# Patient Record
Sex: Female | Born: 1935
Health system: Southern US, Community
[De-identification: ages and names within clinical notes are randomized; demographics above are authoritative.]

## PROBLEM LIST (undated history)

## (undated) DIAGNOSIS — I10 Essential (primary) hypertension: Secondary | ICD-10-CM

## (undated) DIAGNOSIS — C801 Malignant (primary) neoplasm, unspecified: Secondary | ICD-10-CM

## (undated) DIAGNOSIS — R569 Unspecified convulsions: Secondary | ICD-10-CM

## (undated) DIAGNOSIS — I639 Cerebral infarction, unspecified: Secondary | ICD-10-CM

## (undated) DIAGNOSIS — E079 Disorder of thyroid, unspecified: Secondary | ICD-10-CM

## (undated) HISTORY — PX: TONSILLECTOMY: SUR1361

## (undated) HISTORY — PX: SKIN BIOPSY: SHX1

---

## 2013-02-18 ENCOUNTER — Emergency Department (HOSPITAL_COMMUNITY)
Admission: EM | Admit: 2013-02-18 | Discharge: 2013-02-18 | Disposition: A | Discharge status: Home / Self Care | Payer: No Typology Code available for payment source | Attending: Emergency Medicine | Admitting: Emergency Medicine

## 2013-02-18 ENCOUNTER — Encounter (HOSPITAL_COMMUNITY): Payer: Self-pay | Admitting: Emergency Medicine

## 2013-02-18 ENCOUNTER — Emergency Department (HOSPITAL_COMMUNITY): Payer: No Typology Code available for payment source

## 2013-02-18 DIAGNOSIS — S2220XA Unspecified fracture of sternum, initial encounter for closed fracture: Secondary | ICD-10-CM | POA: Insufficient documentation

## 2013-02-18 DIAGNOSIS — Z85828 Personal history of other malignant neoplasm of skin: Secondary | ICD-10-CM | POA: Insufficient documentation

## 2013-02-18 DIAGNOSIS — I10 Essential (primary) hypertension: Secondary | ICD-10-CM | POA: Insufficient documentation

## 2013-02-18 DIAGNOSIS — E079 Disorder of thyroid, unspecified: Secondary | ICD-10-CM | POA: Insufficient documentation

## 2013-02-18 DIAGNOSIS — Y92410 Unspecified street and highway as the place of occurrence of the external cause: Secondary | ICD-10-CM | POA: Insufficient documentation

## 2013-02-18 DIAGNOSIS — Z79899 Other long term (current) drug therapy: Secondary | ICD-10-CM | POA: Insufficient documentation

## 2013-02-18 DIAGNOSIS — Y939 Activity, unspecified: Secondary | ICD-10-CM | POA: Insufficient documentation

## 2013-02-18 HISTORY — DX: Essential (primary) hypertension: I10

## 2013-02-18 HISTORY — DX: Disorder of thyroid, unspecified: E07.9

## 2013-02-18 HISTORY — DX: Malignant (primary) neoplasm, unspecified: C80.1

## 2013-02-18 NOTE — ED Notes (Signed)
Discharge instructions reviewed with pt and her family. Pt verbalized understanding.
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
## 2013-02-18 NOTE — ED Notes (Signed)
Pt returned from xray

## 2013-02-18 NOTE — ED Notes (Signed)
Dr Sheldon at bedside  

## 2013-02-18 NOTE — ED Notes (Signed)
Pt transported to xray 
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
## 2016-01-24 DIAGNOSIS — G40209 Localization-related (focal) (partial) symptomatic epilepsy and epileptic syndromes with complex partial seizures, not intractable, without status epilepticus: Secondary | ICD-10-CM | POA: Diagnosis not present

## 2016-05-03 DIAGNOSIS — E034 Atrophy of thyroid (acquired): Secondary | ICD-10-CM | POA: Diagnosis not present

## 2016-05-03 DIAGNOSIS — I10 Essential (primary) hypertension: Secondary | ICD-10-CM | POA: Diagnosis not present

## 2016-05-05 DIAGNOSIS — I10 Essential (primary) hypertension: Secondary | ICD-10-CM | POA: Diagnosis not present

## 2016-05-05 DIAGNOSIS — E039 Hypothyroidism, unspecified: Secondary | ICD-10-CM | POA: Diagnosis not present

## 2016-06-25 ENCOUNTER — Emergency Department (HOSPITAL_BASED_OUTPATIENT_CLINIC_OR_DEPARTMENT_OTHER)
Admission: EM | Admit: 2016-06-25 | Discharge: 2016-06-26 | Disposition: A | Discharge status: Home / Self Care | Payer: PPO | Attending: Emergency Medicine | Admitting: Emergency Medicine

## 2016-06-25 ENCOUNTER — Emergency Department (HOSPITAL_BASED_OUTPATIENT_CLINIC_OR_DEPARTMENT_OTHER): Payer: PPO

## 2016-06-25 ENCOUNTER — Encounter (HOSPITAL_BASED_OUTPATIENT_CLINIC_OR_DEPARTMENT_OTHER): Payer: Self-pay | Admitting: Emergency Medicine

## 2016-06-25 DIAGNOSIS — Y939 Activity, unspecified: Secondary | ICD-10-CM | POA: Diagnosis not present

## 2016-06-25 DIAGNOSIS — Y929 Unspecified place or not applicable: Secondary | ICD-10-CM | POA: Diagnosis not present

## 2016-06-25 DIAGNOSIS — W108XXA Fall (on) (from) other stairs and steps, initial encounter: Secondary | ICD-10-CM | POA: Insufficient documentation

## 2016-06-25 DIAGNOSIS — M25511 Pain in right shoulder: Secondary | ICD-10-CM | POA: Diagnosis not present

## 2016-06-25 DIAGNOSIS — I10 Essential (primary) hypertension: Secondary | ICD-10-CM | POA: Diagnosis not present

## 2016-06-25 DIAGNOSIS — Y999 Unspecified external cause status: Secondary | ICD-10-CM | POA: Insufficient documentation

## 2016-06-25 DIAGNOSIS — Z85828 Personal history of other malignant neoplasm of skin: Secondary | ICD-10-CM | POA: Insufficient documentation

## 2016-06-25 DIAGNOSIS — S42291A Other displaced fracture of upper end of right humerus, initial encounter for closed fracture: Secondary | ICD-10-CM | POA: Diagnosis not present

## 2016-06-25 DIAGNOSIS — Z79899 Other long term (current) drug therapy: Secondary | ICD-10-CM | POA: Insufficient documentation

## 2016-06-25 DIAGNOSIS — S42201A Unspecified fracture of upper end of right humerus, initial encounter for closed fracture: Secondary | ICD-10-CM | POA: Diagnosis not present

## 2016-06-25 HISTORY — DX: Unspecified convulsions: R56.9

## 2016-06-25 MED ORDER — KETOROLAC TROMETHAMINE 30 MG/ML IJ SOLN
30.0000 mg | Freq: Once | INTRAMUSCULAR | Status: AC
  Administered 2016-06-25: 30 mg via INTRAVENOUS
  Filled 2016-06-25: qty 1

## 2016-06-25 NOTE — ED Notes (Signed)
Pt tripped going up the steps on her porch around 2030 tonight.  She has a deformity and swelling with bruising on upper right arm near shoulder.  Distal pulses present, pt can move fingers and denies numbness or tingling.

## 2016-06-25 NOTE — ED Notes (Signed)
Pt reports tripping and falling onto right shoulder. Denies LOC but hit side of neck on a post. No bleeding at triage. Alert and oriented. NAD. No blood thinners.

## 2016-06-25 NOTE — ED Provider Notes (Signed)
CSN: [id number]     Arrival date & time 06/25/16  2122 History  By signing my name below, I, Georgette Shell, attest that this documentation has been prepared under the direction and in the presence of Reilyn Nelson, MD. Electronically Signed: Georgette Shell, ED Scribe. 06/25/2016. 11:15 PM.   Chief Complaint  Patient presents with  . Fall  . Shoulder Injury    Patient is a 82 y.o. female presenting with shoulder injury. The history is provided by the patient. No language interpreter was used.  Shoulder Injury This is a new problem. The current episode started 3 to 5 hours ago. The problem occurs constantly. The problem has not changed since onset.Pertinent negatives include no chest pain, no abdominal pain, no headaches and no shortness of breath. Nothing aggravates the symptoms. Nothing relieves the symptoms. The treatment provided no relief.    HPI Comments: Sheri Hoffman is a 82 y.o. female who presents to the Emergency Department complaining of right shoulder pain s/p mechanical fall around 2030 tonight. Pt was going up the steps on her porch, pt fell onto her right shoulder.  No head injury.  No LOC no vomiting.  No loss of sensation.  FROM of the forearm wrist and hand. Pt reports she is in no pain unless she moves her right shoulder. No LOC. No alleviating factors noted. Patient is not on blood thinners. Pt denies numbness, vomiting and paresthesia.    Past Medical History  Diagnosis Date  . Hypertension   . Thyroid disease   . Cancer ([place])     skin cancer  . Seizures ([place])     possible, pt is on medication to prevent seizures   Past Surgical History  Procedure Laterality Date  . Skin biopsy    . Tonsillectomy     No family history on file. Social History  Substance Use Topics  . Smoking status: Never Smoker   . Smokeless tobacco: None  . Alcohol Use: No   OB History    No data available     Review of Systems  Constitutional: Negative for fever.  Eyes: Negative for  photophobia and visual disturbance.  Respiratory: Negative for shortness of breath.   Cardiovascular: Negative for chest pain.  Gastrointestinal: Negative for nausea, vomiting and abdominal pain.  Musculoskeletal: Positive for arthralgias. Negative for neck pain and neck stiffness.  Neurological: Negative for dizziness, seizures, syncope, facial asymmetry, weakness, numbness and headaches.  All other systems reviewed and are negative.     Allergies  Hydrochlorothiazide  Home Medications   Prior to Admission medications   Medication Sig Start Date End Date Taking? Authorizing Provider  amLODipine (NORVASC) 5 MG tablet Take 2.5 mg by mouth at bedtime.   Yes Historical Provider, MD  carvedilol (COREG) 25 MG tablet Take 12.5 mg by mouth daily.    Yes Historical Provider, MD  levETIRAcetam (KEPPRA) 500 MG tablet Take 500 mg by mouth 2 (two) times daily.   Yes Historical Provider, MD  levothyroxine (SYNTHROID, LEVOTHROID) 88 MCG tablet Take 88 mcg by mouth daily.   Yes Historical Provider, MD   BP 165/78 mmHg  Pulse 68  Temp(Src) 98.3 F (36.8 C) (Oral)  Resp 16  Ht 4\' 10"  (1.473 m)  Wt 100 lb (45.36 kg)  BMI 20.91 kg/m2  SpO2 95% Physical Exam  Constitutional: She appears well-developed and well-nourished. No distress.  HENT:  Head: Normocephalic. Head is without raccoon's eyes and without Battle's sign.  Right Ear: No mastoid tenderness. No hemotympanum.  Left Ear: No mastoid tenderness. No hemotympanum.  Mouth/Throat: Oropharynx is clear and moist. No oropharyngeal exudate.  Eyes: Conjunctivae and EOM are normal. Pupils are equal, round, and reactive to light. Right eye exhibits no discharge. Left eye exhibits no discharge. No scleral icterus.  No battle signs, no racoon eyes  Neck: Normal range of motion. Neck supple. No JVD present. No tracheal deviation present.  Cardiovascular: Normal rate, regular rhythm, normal heart sounds and intact distal pulses.   No murmur  heard. Pulmonary/Chest: Effort normal and breath sounds normal. No stridor. No respiratory distress. She has no wheezes. She has no rales.  Lungs CTA bilaterally.  Abdominal: Soft. Bowel sounds are normal. She exhibits no distension. There is no tenderness. There is no rebound and no guarding.  Musculoskeletal: She exhibits tenderness.       Right shoulder: She exhibits bony tenderness and swelling. She exhibits no effusion, no crepitus, no laceration, no spasm and normal pulse.       Right elbow: Normal.      Right wrist: Normal.       Cervical back: Normal.       Right upper arm: Normal.       Right forearm: Normal.       Right hand: Normal. She exhibits normal capillary refill. Normal sensation noted. Normal strength noted.  Lymphadenopathy:    She has no cervical adenopathy.  Neurological: She is alert. She displays normal reflexes. She exhibits normal muscle tone.  Skin: Skin is warm.  Bruise over the mid bicep, impacted.There is no crepitus, no effusion.   Psychiatric: She has a normal mood and affect. Her behavior is normal.  Nursing note and vitals reviewed.   ED Course  Procedures  DIAGNOSTIC STUDIES: Oxygen Saturation is 95% on RA, poor by my interpretation.    COORDINATION OF CARE: 11:15 PM Discussed treatment plan with pt at bedside which includes Orthopedic follow-up and pt agreed to plan.  Imaging Review Dg Shoulder Right  06/25/2016  CLINICAL DATA:  Status post fall. EXAM: RIGHT SHOULDER - 2+ VIEW COMPARISON:  None. FINDINGS: There is a comminuted fracture of the right humeral neck with overriding fracture fragments and superior displacement of the distal fracture fragment. There is a probable associated anterior glenohumeral dislocation. There is an associated soft tissue swelling. IMPRESSION: Comminuted impacted fracture of the right humeral neck with probable associated anterior dislocation of the shoulder. Electronically Signed   By: Fidela Salisbury M.D.   On:  06/25/2016 22:18   I have personally reviewed and evaluated these images as part of my medical decision-making.   MDM   Final diagnoses:  None    Filed Vitals:   06/25/16 2332 06/26/16 0147  BP: 152/67 143/73  Pulse: 68 69  Temp:    Resp: 16 16   No results found for this or any previous visit. Dg Shoulder Right  06/25/2016  CLINICAL DATA:  Status post fall. EXAM: RIGHT SHOULDER - 2+ VIEW COMPARISON:  None. FINDINGS: There is a comminuted fracture of the right humeral neck with overriding fracture fragments and superior displacement of the distal fracture fragment. There is a probable associated anterior glenohumeral dislocation. There is an associated soft tissue swelling. IMPRESSION: Comminuted impacted fracture of the right humeral neck with probable associated anterior dislocation of the shoulder. Electronically Signed   By: Fidela Salisbury M.D.   On: 06/25/2016 22:18   Ct Shoulder Right Wo Contrast  06/26/2016  CLINICAL DATA:  [place] yesterday. Right humeral fracture on plain  films. EXAM: CT OF THE RIGHT SHOULDER WITHOUT CONTRAST TECHNIQUE: Multidetector CT imaging was performed according to the standard protocol. Multiplanar CT image reconstructions were also generated. COMPARISON:  Right humerus 06/25/2016 FINDINGS: There are comminuted fractures of the right humeral head and neck with fracture lines extending into the greater and lesser trochanteric regions. There is impaction of the fracture fragments with medial rotation of the major humeral head fragment. No glenohumeral or acromioclavicular dislocation. The glenoid and scapula appear intact. Visualized clavicle appears intact. Visualized ribs appear intact. Infiltration in the subcutaneous and intramuscular fat of the right shoulder consistent with hematoma. Incidental note of emphysematous changes in the right upper lung. Degenerative changes in the spine. No focal bone lesions appreciated. IMPRESSION: Comminuted fractures of  the right humeral head and neck with impaction of fracture fragments and medial rotation of the humeral head fragment. No evidence of glenohumeral dislocation. Electronically Signed   By: Lucienne Capers M.D.   On: 06/26/2016 01:18    Medications  ketorolac (TORADOL) 30 MG/ML injection 30 mg (30 mg Intravenous Given 06/25/16 2332)    1130 Case d/w Dr. Rolena Infante of orthopedics, obtain CT please call back with results  130 case d/e Dr. Rolena Infante please place in immobilizer have patient contact the office this am to be seen by Dr. Veverly Fells later today.    Images reviewed with patient and family using in room computer screen.  Nurse present for all instructions.  All questions answered to family's satisfaction.   Patient and family understand all discharge and follow up instructions.  RX for pain medication provided.  Continue to ice shoulder.   Strict return precautions given   I personally performed the services described in this documentation, which was scribed in my presence. The recorded information has been reviewed and is accurate.     Veatrice Kells, MD 06/26/16 [phone]

## 2016-06-26 ENCOUNTER — Encounter (HOSPITAL_BASED_OUTPATIENT_CLINIC_OR_DEPARTMENT_OTHER): Payer: Self-pay | Admitting: Emergency Medicine

## 2016-06-26 DIAGNOSIS — S42291A Other displaced fracture of upper end of right humerus, initial encounter for closed fracture: Secondary | ICD-10-CM | POA: Diagnosis not present

## 2016-06-26 MED ORDER — TRAMADOL HCL 50 MG PO TABS: 50.0000 mg | ORAL_TABLET | Freq: Four times a day (QID) | ORAL | Status: AC | PRN

## 2016-06-26 NOTE — ED Notes (Signed)
Pt returned from CT °

## 2016-06-26 NOTE — ED Notes (Signed)
Pt and family verbalize understanding of dc instructions and deny any further needs at this time 

## 2016-06-26 NOTE — ED Notes (Signed)
Patient transported to CT 

## 2016-06-26 NOTE — ED Notes (Signed)
Pt ambulated to restroom. 

## 2016-07-03 DIAGNOSIS — S42271A Torus fracture of upper end of right humerus, initial encounter for closed fracture: Secondary | ICD-10-CM | POA: Diagnosis not present

## 2016-07-19 DIAGNOSIS — S42271D Torus fracture of upper end of right humerus, subsequent encounter for fracture with routine healing: Secondary | ICD-10-CM | POA: Diagnosis not present

## 2016-08-07 DIAGNOSIS — S42271D Torus fracture of upper end of right humerus, subsequent encounter for fracture with routine healing: Secondary | ICD-10-CM | POA: Diagnosis not present

## 2016-08-13 DIAGNOSIS — Z9181 History of falling: Secondary | ICD-10-CM | POA: Diagnosis not present

## 2016-08-13 DIAGNOSIS — S42271D Torus fracture of upper end of right humerus, subsequent encounter for fracture with routine healing: Secondary | ICD-10-CM | POA: Diagnosis not present

## 2016-08-15 DIAGNOSIS — Z9181 History of falling: Secondary | ICD-10-CM | POA: Diagnosis not present

## 2016-08-15 DIAGNOSIS — S42271D Torus fracture of upper end of right humerus, subsequent encounter for fracture with routine healing: Secondary | ICD-10-CM | POA: Diagnosis not present

## 2016-08-21 DIAGNOSIS — S42271D Torus fracture of upper end of right humerus, subsequent encounter for fracture with routine healing: Secondary | ICD-10-CM | POA: Diagnosis not present

## 2016-08-21 DIAGNOSIS — Z9181 History of falling: Secondary | ICD-10-CM | POA: Diagnosis not present

## 2016-08-27 DIAGNOSIS — Z9181 History of falling: Secondary | ICD-10-CM | POA: Diagnosis not present

## 2016-08-27 DIAGNOSIS — S42271D Torus fracture of upper end of right humerus, subsequent encounter for fracture with routine healing: Secondary | ICD-10-CM | POA: Diagnosis not present

## 2016-09-03 DIAGNOSIS — S42271D Torus fracture of upper end of right humerus, subsequent encounter for fracture with routine healing: Secondary | ICD-10-CM | POA: Diagnosis not present

## 2016-09-03 DIAGNOSIS — Z9181 History of falling: Secondary | ICD-10-CM | POA: Diagnosis not present

## 2016-09-04 DIAGNOSIS — G40209 Localization-related (focal) (partial) symptomatic epilepsy and epileptic syndromes with complex partial seizures, not intractable, without status epilepticus: Secondary | ICD-10-CM | POA: Diagnosis not present

## 2016-09-10 DIAGNOSIS — Z9181 History of falling: Secondary | ICD-10-CM | POA: Diagnosis not present

## 2016-09-10 DIAGNOSIS — S42271D Torus fracture of upper end of right humerus, subsequent encounter for fracture with routine healing: Secondary | ICD-10-CM | POA: Diagnosis not present

## 2016-09-11 DIAGNOSIS — S42271D Torus fracture of upper end of right humerus, subsequent encounter for fracture with routine healing: Secondary | ICD-10-CM | POA: Diagnosis not present

## 2016-09-17 DIAGNOSIS — Z9181 History of falling: Secondary | ICD-10-CM | POA: Diagnosis not present

## 2016-09-17 DIAGNOSIS — S42271D Torus fracture of upper end of right humerus, subsequent encounter for fracture with routine healing: Secondary | ICD-10-CM | POA: Diagnosis not present

## 2016-09-24 DIAGNOSIS — S42271D Torus fracture of upper end of right humerus, subsequent encounter for fracture with routine healing: Secondary | ICD-10-CM | POA: Diagnosis not present

## 2016-09-24 DIAGNOSIS — Z9181 History of falling: Secondary | ICD-10-CM | POA: Diagnosis not present

## 2016-09-25 DIAGNOSIS — Z23 Encounter for immunization: Secondary | ICD-10-CM | POA: Diagnosis not present

## 2016-09-25 DIAGNOSIS — I10 Essential (primary) hypertension: Secondary | ICD-10-CM | POA: Diagnosis not present

## 2016-09-25 DIAGNOSIS — E039 Hypothyroidism, unspecified: Secondary | ICD-10-CM | POA: Diagnosis not present

## 2016-10-01 DIAGNOSIS — Z9181 History of falling: Secondary | ICD-10-CM | POA: Diagnosis not present

## 2016-10-01 DIAGNOSIS — S42271D Torus fracture of upper end of right humerus, subsequent encounter for fracture with routine healing: Secondary | ICD-10-CM | POA: Diagnosis not present

## 2016-12-28 DIAGNOSIS — E039 Hypothyroidism, unspecified: Secondary | ICD-10-CM | POA: Diagnosis not present

## 2016-12-28 DIAGNOSIS — I10 Essential (primary) hypertension: Secondary | ICD-10-CM | POA: Diagnosis not present

## 2017-03-04 DIAGNOSIS — G40209 Localization-related (focal) (partial) symptomatic epilepsy and epileptic syndromes with complex partial seizures, not intractable, without status epilepticus: Secondary | ICD-10-CM | POA: Diagnosis not present

## 2017-06-28 DIAGNOSIS — E039 Hypothyroidism, unspecified: Secondary | ICD-10-CM | POA: Diagnosis not present

## 2017-06-28 DIAGNOSIS — I10 Essential (primary) hypertension: Secondary | ICD-10-CM | POA: Diagnosis not present

## 2017-06-28 DIAGNOSIS — Z7689 Persons encountering health services in other specified circumstances: Secondary | ICD-10-CM | POA: Diagnosis not present

## 2017-06-28 DIAGNOSIS — G40209 Localization-related (focal) (partial) symptomatic epilepsy and epileptic syndromes with complex partial seizures, not intractable, without status epilepticus: Secondary | ICD-10-CM | POA: Diagnosis not present

## 2017-08-16 DIAGNOSIS — E039 Hypothyroidism, unspecified: Secondary | ICD-10-CM | POA: Diagnosis not present

## 2017-08-16 DIAGNOSIS — I10 Essential (primary) hypertension: Secondary | ICD-10-CM | POA: Diagnosis not present

## 2017-09-02 DIAGNOSIS — G40209 Localization-related (focal) (partial) symptomatic epilepsy and epileptic syndromes with complex partial seizures, not intractable, without status epilepticus: Secondary | ICD-10-CM | POA: Diagnosis not present

## 2017-12-30 DIAGNOSIS — Z0000 Encounter for general adult medical examination without abnormal findings: Secondary | ICD-10-CM | POA: Diagnosis not present

## 2017-12-30 DIAGNOSIS — M8589 Other specified disorders of bone density and structure, multiple sites: Secondary | ICD-10-CM | POA: Diagnosis not present

## 2017-12-30 DIAGNOSIS — Z23 Encounter for immunization: Secondary | ICD-10-CM | POA: Diagnosis not present

## 2017-12-30 DIAGNOSIS — L89312 Pressure ulcer of right buttock, stage 2: Secondary | ICD-10-CM | POA: Diagnosis not present

## 2017-12-30 DIAGNOSIS — I10 Essential (primary) hypertension: Secondary | ICD-10-CM | POA: Diagnosis not present

## 2017-12-30 DIAGNOSIS — E039 Hypothyroidism, unspecified: Secondary | ICD-10-CM | POA: Diagnosis not present

## 2018-03-04 DIAGNOSIS — G40209 Localization-related (focal) (partial) symptomatic epilepsy and epileptic syndromes with complex partial seizures, not intractable, without status epilepticus: Secondary | ICD-10-CM | POA: Diagnosis not present

## 2018-06-02 IMAGING — DX DG SHOULDER 2+V*R*
2 series · 2 of 2 positions shown · non-contrast
Comparison: None.

CLINICAL DATA: Status post fall.

EXAM:
RIGHT SHOULDER - 2+ VIEW

[shoulder grashey]
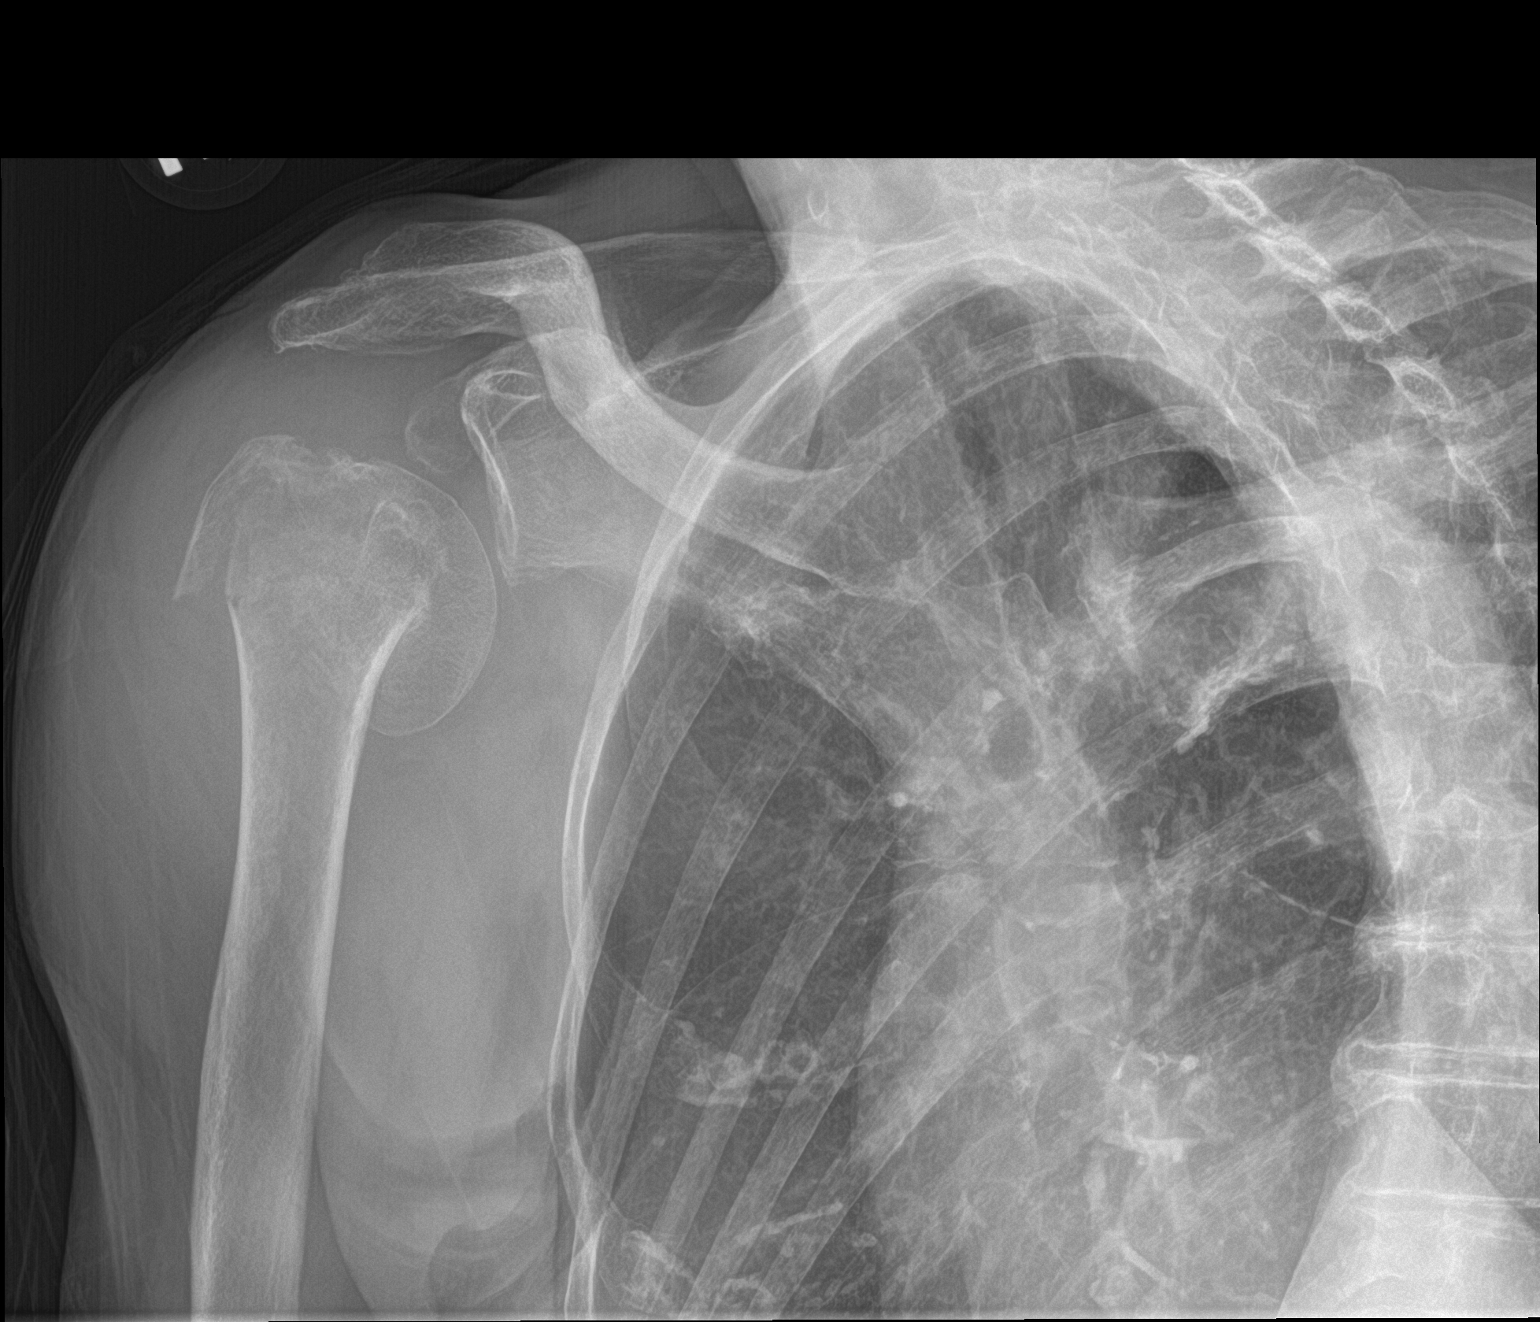

[shoulder y view]
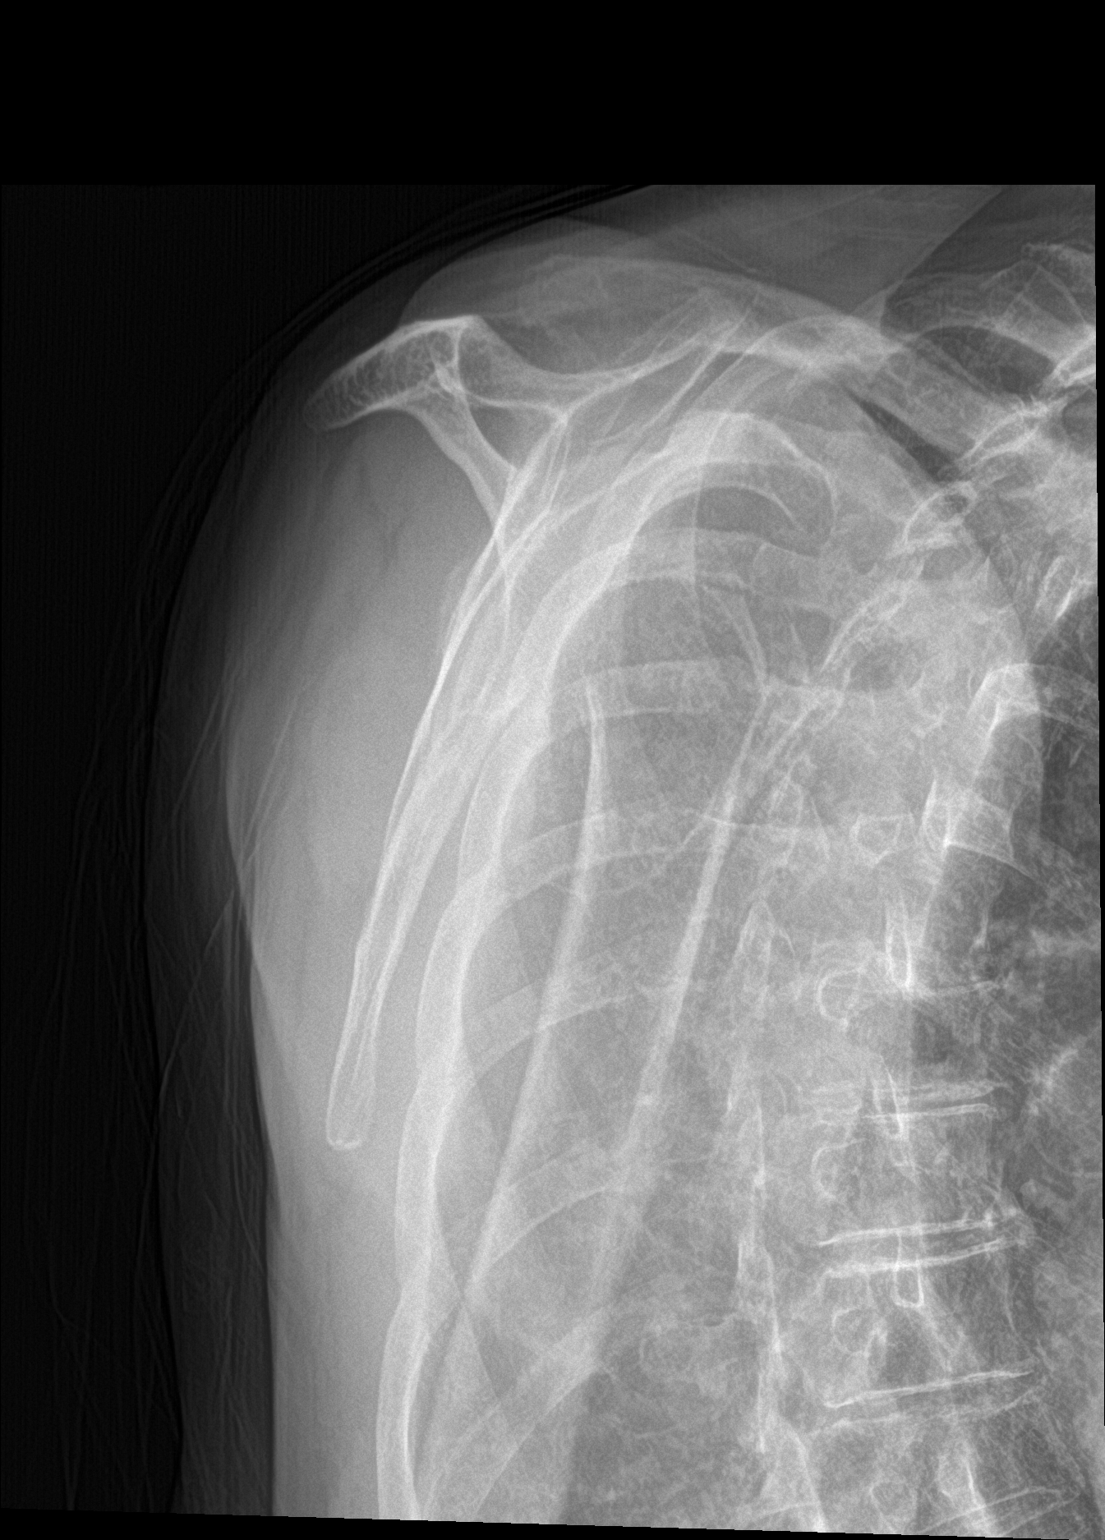

[2 of 2 positions shown; findings below may reference images not displayed]

FINDINGS: There is a comminuted fracture of the right humeral neck with
overriding fracture fragments and superior displacement of the
distal fracture fragment. There is a probable associated anterior
glenohumeral dislocation. There is an associated soft tissue
swelling.
IMPRESSION: Comminuted impacted fracture of the right humeral neck with probable
associated anterior dislocation of the shoulder.

## 2018-06-03 IMAGING — CT CT SHOULDER*R* W/O CM
3 series · 13 of 33 positions shown, 16 images · non-contrast
Comparison: Right humerus 06/25/2016

CLINICAL DATA: Fell yesterday. Right humeral fracture on plain
films.

EXAM:
CT OF THE RIGHT SHOULDER WITHOUT CONTRAST
TECHNIQUE: Multidetector CT imaging was performed according to the standard
protocol. Multiplanar CT image reconstructions were also generated.

[Series 6: cor obl st · coronal · 0.29mm/px · 3 of 99 slices shown]
[im 20/99  bone]
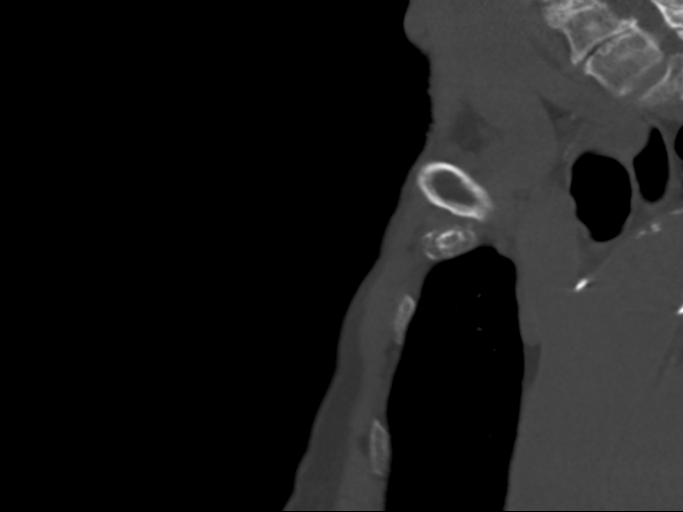
[im 40/99  bone]
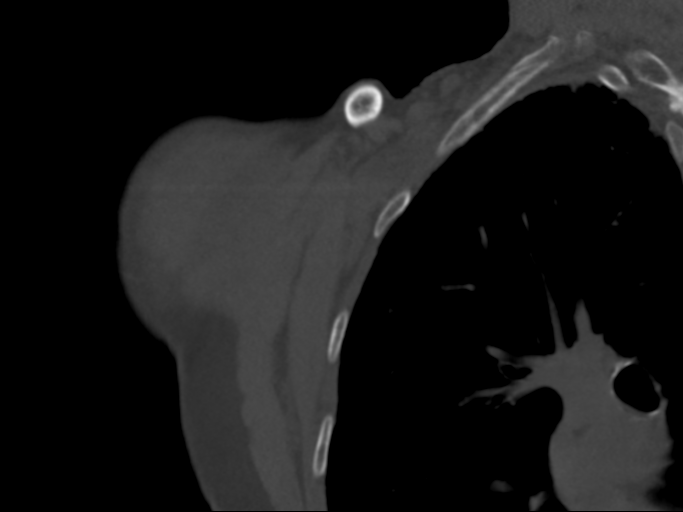
[im 59/99  bone]
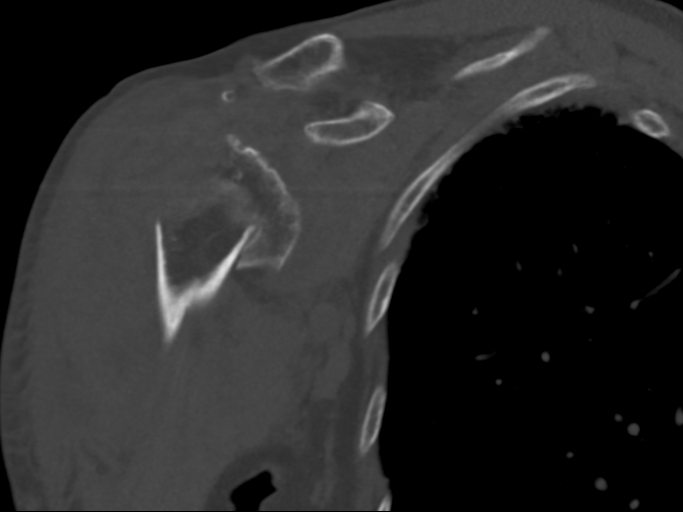

[Series 8: sag obl st · sagittal · 0.29mm/px · 5 of 118 slices shown, 6 images]
[im 40/118  bone]
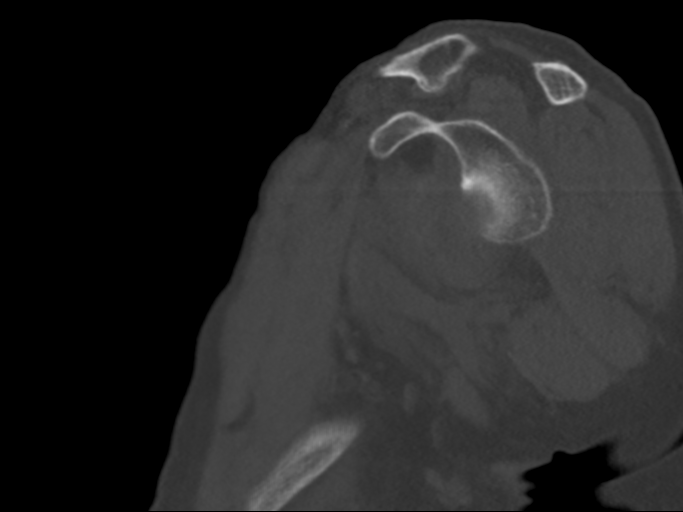
[im 49/118  bone]
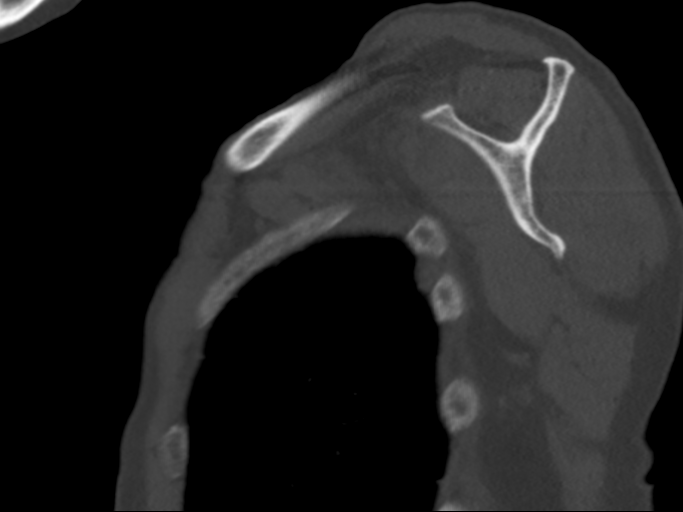
[im 59/118  soft-tissue]
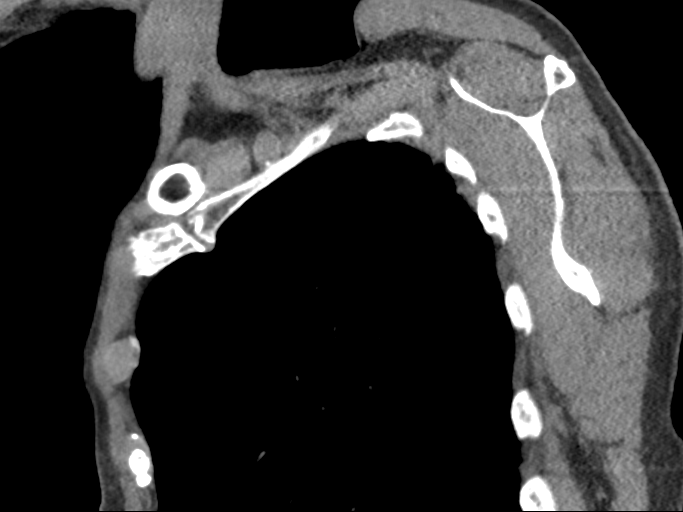
[im 59/118  bone]
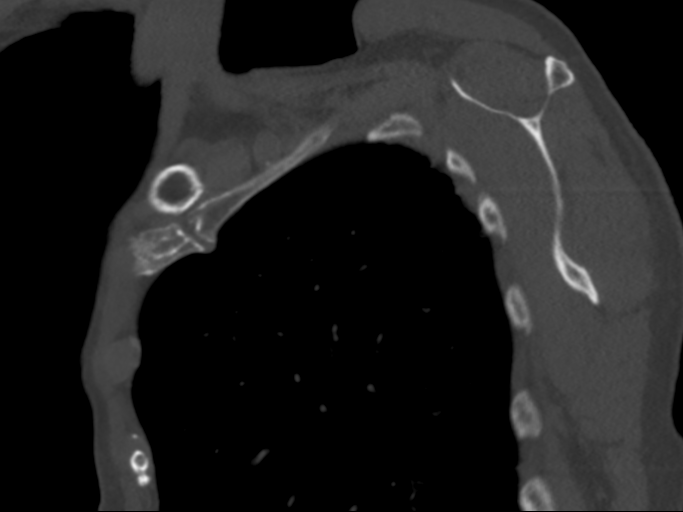
[im 69/118  bone]
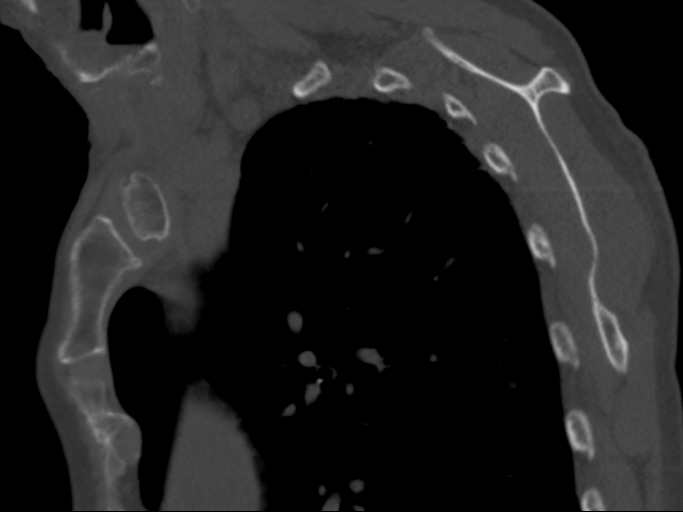
[im 79/118  bone]
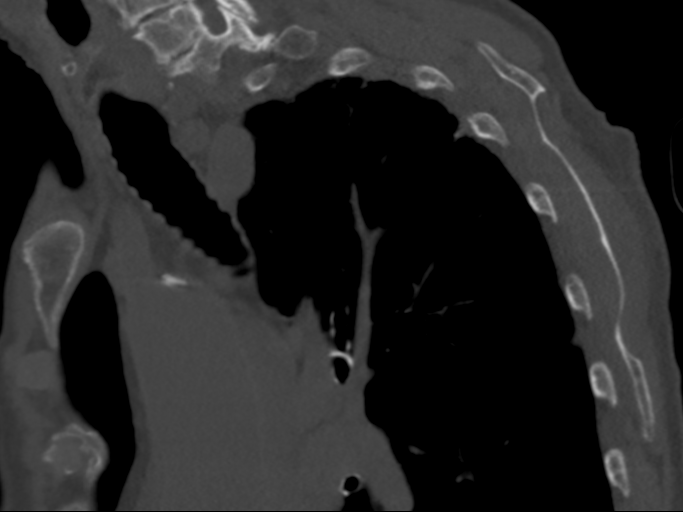

[Series 10: ax st · axial · 0.39mm/px · z∈[-179,-77]mm · 5 of 75 slices shown, 7 images]
[im 12/75  soft-tissue]
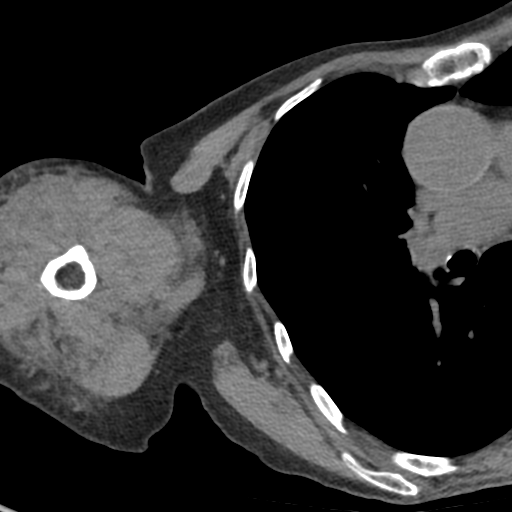
[im 12/75  bone]
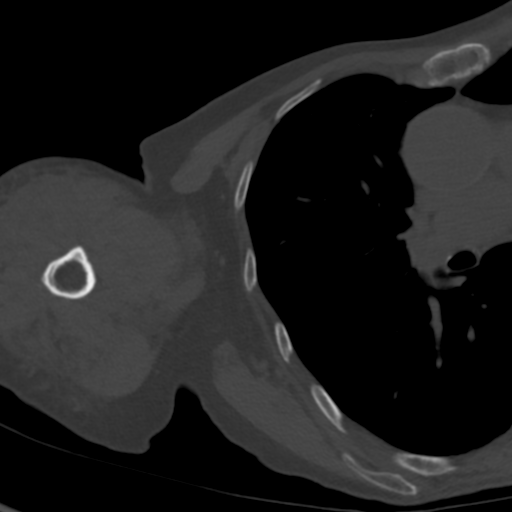
[im 23/75  bone]
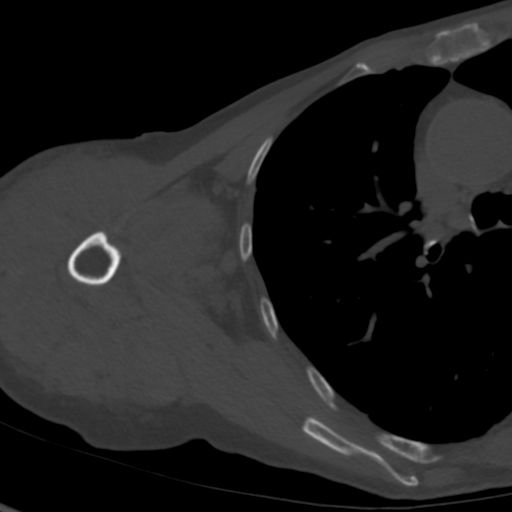
[im 40/75  bone]
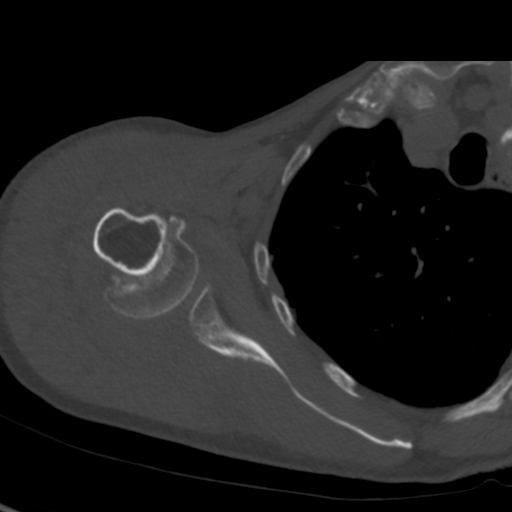
[im 52/75  bone]
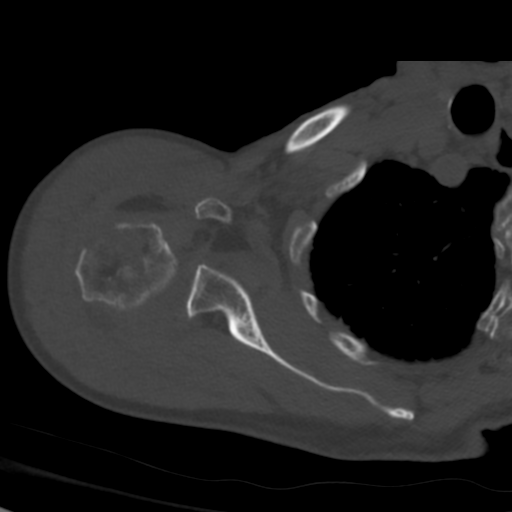
[im 63/75  soft-tissue]
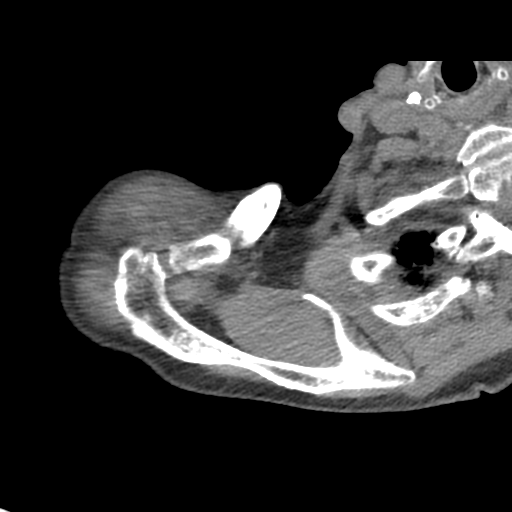
[im 63/75  bone]
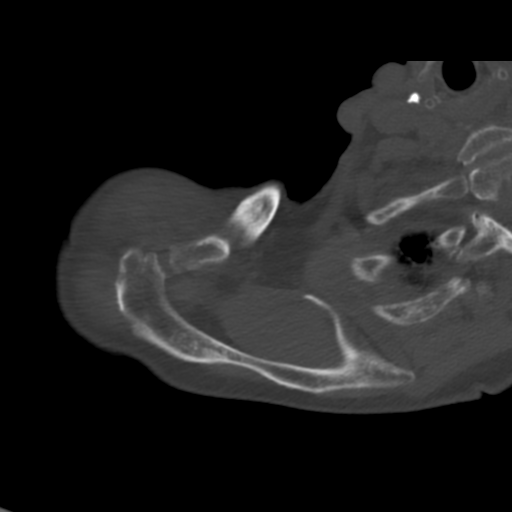

[13 of 33 positions shown; findings below may reference images not displayed]

FINDINGS: There are comminuted fractures of the right humeral head and neck
with fracture lines extending into the greater and lesser
trochanteric regions. There is impaction of the fracture fragments
with medial rotation of the major humeral head fragment. No
glenohumeral or acromioclavicular dislocation. The glenoid and
scapula appear intact. Visualized clavicle appears intact.
Visualized ribs appear intact. Infiltration in the subcutaneous and
intramuscular fat of the right shoulder consistent with hematoma.

Incidental note of emphysematous changes in the right upper lung.
Degenerative changes in the spine. No focal bone lesions
appreciated.
IMPRESSION: Comminuted fractures of the right humeral head and neck with
impaction of fracture fragments and medial rotation of the humeral
head fragment. No evidence of glenohumeral dislocation.

## 2018-07-01 DIAGNOSIS — E039 Hypothyroidism, unspecified: Secondary | ICD-10-CM | POA: Diagnosis not present

## 2018-07-01 DIAGNOSIS — R634 Abnormal weight loss: Secondary | ICD-10-CM | POA: Diagnosis not present

## 2018-07-01 DIAGNOSIS — I10 Essential (primary) hypertension: Secondary | ICD-10-CM | POA: Diagnosis not present

## 2018-07-01 DIAGNOSIS — M8589 Other specified disorders of bone density and structure, multiple sites: Secondary | ICD-10-CM | POA: Diagnosis not present

## 2018-09-04 DIAGNOSIS — G40209 Localization-related (focal) (partial) symptomatic epilepsy and epileptic syndromes with complex partial seizures, not intractable, without status epilepticus: Secondary | ICD-10-CM | POA: Diagnosis not present

## 2018-09-30 DIAGNOSIS — E860 Dehydration: Secondary | ICD-10-CM | POA: Diagnosis not present

## 2018-09-30 DIAGNOSIS — R110 Nausea: Secondary | ICD-10-CM | POA: Diagnosis not present

## 2018-09-30 DIAGNOSIS — R001 Bradycardia, unspecified: Secondary | ICD-10-CM | POA: Diagnosis not present

## 2018-09-30 DIAGNOSIS — R42 Dizziness and giddiness: Secondary | ICD-10-CM | POA: Diagnosis not present

## 2018-09-30 DIAGNOSIS — S299XXA Unspecified injury of thorax, initial encounter: Secondary | ICD-10-CM | POA: Diagnosis not present

## 2018-09-30 DIAGNOSIS — I959 Hypotension, unspecified: Secondary | ICD-10-CM | POA: Diagnosis not present

## 2018-09-30 DIAGNOSIS — W1839XA Other fall on same level, initial encounter: Secondary | ICD-10-CM | POA: Diagnosis not present

## 2018-09-30 DIAGNOSIS — R9431 Abnormal electrocardiogram [ECG] [EKG]: Secondary | ICD-10-CM | POA: Diagnosis not present

## 2018-09-30 DIAGNOSIS — M6281 Muscle weakness (generalized): Secondary | ICD-10-CM | POA: Diagnosis not present

## 2018-09-30 DIAGNOSIS — R531 Weakness: Secondary | ICD-10-CM | POA: Diagnosis not present

## 2018-09-30 DIAGNOSIS — Y998 Other external cause status: Secondary | ICD-10-CM | POA: Diagnosis not present

## 2018-10-01 DIAGNOSIS — Z23 Encounter for immunization: Secondary | ICD-10-CM | POA: Diagnosis not present

## 2018-10-01 DIAGNOSIS — I10 Essential (primary) hypertension: Secondary | ICD-10-CM | POA: Diagnosis not present

## 2018-10-01 DIAGNOSIS — E039 Hypothyroidism, unspecified: Secondary | ICD-10-CM | POA: Diagnosis not present

## 2019-01-01 DIAGNOSIS — Z0000 Encounter for general adult medical examination without abnormal findings: Secondary | ICD-10-CM | POA: Diagnosis not present

## 2019-01-01 DIAGNOSIS — E039 Hypothyroidism, unspecified: Secondary | ICD-10-CM | POA: Diagnosis not present

## 2019-01-01 DIAGNOSIS — L989 Disorder of the skin and subcutaneous tissue, unspecified: Secondary | ICD-10-CM | POA: Diagnosis not present

## 2019-01-01 DIAGNOSIS — E559 Vitamin D deficiency, unspecified: Secondary | ICD-10-CM | POA: Diagnosis not present

## 2019-01-01 DIAGNOSIS — I10 Essential (primary) hypertension: Secondary | ICD-10-CM | POA: Diagnosis not present

## 2019-05-07 DIAGNOSIS — E039 Hypothyroidism, unspecified: Secondary | ICD-10-CM | POA: Diagnosis not present

## 2019-05-07 DIAGNOSIS — R296 Repeated falls: Secondary | ICD-10-CM | POA: Diagnosis not present

## 2019-05-07 DIAGNOSIS — E559 Vitamin D deficiency, unspecified: Secondary | ICD-10-CM | POA: Diagnosis not present

## 2019-05-07 DIAGNOSIS — G40209 Localization-related (focal) (partial) symptomatic epilepsy and epileptic syndromes with complex partial seizures, not intractable, without status epilepticus: Secondary | ICD-10-CM | POA: Diagnosis not present

## 2019-05-07 DIAGNOSIS — I10 Essential (primary) hypertension: Secondary | ICD-10-CM | POA: Diagnosis not present

## 2019-05-21 DIAGNOSIS — D485 Neoplasm of uncertain behavior of skin: Secondary | ICD-10-CM | POA: Diagnosis not present

## 2019-05-21 DIAGNOSIS — C44311 Basal cell carcinoma of skin of nose: Secondary | ICD-10-CM | POA: Diagnosis not present

## 2019-05-21 DIAGNOSIS — L821 Other seborrheic keratosis: Secondary | ICD-10-CM | POA: Diagnosis not present

## 2019-05-21 DIAGNOSIS — L249 Irritant contact dermatitis, unspecified cause: Secondary | ICD-10-CM | POA: Diagnosis not present

## 2019-08-19 DIAGNOSIS — E039 Hypothyroidism, unspecified: Secondary | ICD-10-CM | POA: Diagnosis not present

## 2019-08-19 DIAGNOSIS — R636 Underweight: Secondary | ICD-10-CM | POA: Diagnosis not present

## 2019-08-19 DIAGNOSIS — Z23 Encounter for immunization: Secondary | ICD-10-CM | POA: Diagnosis not present

## 2019-08-19 DIAGNOSIS — E559 Vitamin D deficiency, unspecified: Secondary | ICD-10-CM | POA: Diagnosis not present

## 2019-08-19 DIAGNOSIS — Z681 Body mass index (BMI) 19 or less, adult: Secondary | ICD-10-CM | POA: Diagnosis not present

## 2019-08-19 DIAGNOSIS — I10 Essential (primary) hypertension: Secondary | ICD-10-CM | POA: Diagnosis not present

## 2019-09-03 DIAGNOSIS — G40209 Localization-related (focal) (partial) symptomatic epilepsy and epileptic syndromes with complex partial seizures, not intractable, without status epilepticus: Secondary | ICD-10-CM | POA: Diagnosis not present

## 2019-11-24 DIAGNOSIS — Z08 Encounter for follow-up examination after completed treatment for malignant neoplasm: Secondary | ICD-10-CM | POA: Diagnosis not present

## 2019-11-24 DIAGNOSIS — L814 Other melanin hyperpigmentation: Secondary | ICD-10-CM | POA: Diagnosis not present

## 2019-11-24 DIAGNOSIS — L821 Other seborrheic keratosis: Secondary | ICD-10-CM | POA: Diagnosis not present

## 2019-11-24 DIAGNOSIS — Z85828 Personal history of other malignant neoplasm of skin: Secondary | ICD-10-CM | POA: Diagnosis not present

## 2020-01-05 DIAGNOSIS — L0390 Cellulitis, unspecified: Secondary | ICD-10-CM | POA: Diagnosis not present

## 2020-01-19 DIAGNOSIS — R636 Underweight: Secondary | ICD-10-CM | POA: Diagnosis not present

## 2020-01-19 DIAGNOSIS — I10 Essential (primary) hypertension: Secondary | ICD-10-CM | POA: Diagnosis not present

## 2020-01-19 DIAGNOSIS — E039 Hypothyroidism, unspecified: Secondary | ICD-10-CM | POA: Diagnosis not present

## 2020-01-19 DIAGNOSIS — Z681 Body mass index (BMI) 19 or less, adult: Secondary | ICD-10-CM | POA: Diagnosis not present

## 2020-01-19 DIAGNOSIS — G40209 Localization-related (focal) (partial) symptomatic epilepsy and epileptic syndromes with complex partial seizures, not intractable, without status epilepticus: Secondary | ICD-10-CM | POA: Diagnosis not present

## 2020-01-19 DIAGNOSIS — E559 Vitamin D deficiency, unspecified: Secondary | ICD-10-CM | POA: Diagnosis not present

## 2020-01-19 DIAGNOSIS — Z0000 Encounter for general adult medical examination without abnormal findings: Secondary | ICD-10-CM | POA: Diagnosis not present

## 2020-08-16 DIAGNOSIS — G40209 Localization-related (focal) (partial) symptomatic epilepsy and epileptic syndromes with complex partial seizures, not intractable, without status epilepticus: Secondary | ICD-10-CM | POA: Diagnosis not present

## 2020-10-07 DIAGNOSIS — E039 Hypothyroidism, unspecified: Secondary | ICD-10-CM | POA: Diagnosis not present

## 2020-10-07 DIAGNOSIS — L729 Follicular cyst of the skin and subcutaneous tissue, unspecified: Secondary | ICD-10-CM | POA: Diagnosis not present

## 2020-10-07 DIAGNOSIS — Z23 Encounter for immunization: Secondary | ICD-10-CM | POA: Diagnosis not present

## 2020-10-07 DIAGNOSIS — E559 Vitamin D deficiency, unspecified: Secondary | ICD-10-CM | POA: Diagnosis not present

## 2020-10-07 DIAGNOSIS — L089 Local infection of the skin and subcutaneous tissue, unspecified: Secondary | ICD-10-CM | POA: Diagnosis not present

## 2020-10-07 DIAGNOSIS — R636 Underweight: Secondary | ICD-10-CM | POA: Diagnosis not present

## 2020-10-07 DIAGNOSIS — I10 Essential (primary) hypertension: Secondary | ICD-10-CM | POA: Diagnosis not present

## 2020-10-17 DIAGNOSIS — L089 Local infection of the skin and subcutaneous tissue, unspecified: Secondary | ICD-10-CM | POA: Diagnosis not present

## 2020-10-17 DIAGNOSIS — R55 Syncope and collapse: Secondary | ICD-10-CM | POA: Diagnosis not present

## 2020-10-17 DIAGNOSIS — L729 Follicular cyst of the skin and subcutaneous tissue, unspecified: Secondary | ICD-10-CM | POA: Diagnosis not present

## 2020-10-17 DIAGNOSIS — R197 Diarrhea, unspecified: Secondary | ICD-10-CM | POA: Diagnosis not present

## 2020-10-24 DIAGNOSIS — L089 Local infection of the skin and subcutaneous tissue, unspecified: Secondary | ICD-10-CM | POA: Diagnosis not present

## 2020-10-24 DIAGNOSIS — L729 Follicular cyst of the skin and subcutaneous tissue, unspecified: Secondary | ICD-10-CM | POA: Diagnosis not present

## 2020-10-24 DIAGNOSIS — Z87898 Personal history of other specified conditions: Secondary | ICD-10-CM | POA: Diagnosis not present

## 2021-03-15 DIAGNOSIS — E559 Vitamin D deficiency, unspecified: Secondary | ICD-10-CM | POA: Diagnosis not present

## 2021-03-15 DIAGNOSIS — R600 Localized edema: Secondary | ICD-10-CM | POA: Diagnosis not present

## 2021-03-15 DIAGNOSIS — Z0000 Encounter for general adult medical examination without abnormal findings: Secondary | ICD-10-CM | POA: Diagnosis not present

## 2021-03-15 DIAGNOSIS — I10 Essential (primary) hypertension: Secondary | ICD-10-CM | POA: Diagnosis not present

## 2021-03-15 DIAGNOSIS — L729 Follicular cyst of the skin and subcutaneous tissue, unspecified: Secondary | ICD-10-CM | POA: Diagnosis not present

## 2021-03-15 DIAGNOSIS — G40209 Localization-related (focal) (partial) symptomatic epilepsy and epileptic syndromes with complex partial seizures, not intractable, without status epilepticus: Secondary | ICD-10-CM | POA: Diagnosis not present

## 2021-03-15 DIAGNOSIS — E039 Hypothyroidism, unspecified: Secondary | ICD-10-CM | POA: Diagnosis not present

## 2021-03-15 DIAGNOSIS — R636 Underweight: Secondary | ICD-10-CM | POA: Diagnosis not present

## 2021-03-15 DIAGNOSIS — L089 Local infection of the skin and subcutaneous tissue, unspecified: Secondary | ICD-10-CM | POA: Diagnosis not present

## 2021-03-30 DIAGNOSIS — L89320 Pressure ulcer of left buttock, unstageable: Secondary | ICD-10-CM | POA: Diagnosis not present

## 2021-04-04 DIAGNOSIS — I10 Essential (primary) hypertension: Secondary | ICD-10-CM | POA: Diagnosis not present

## 2021-04-04 DIAGNOSIS — R636 Underweight: Secondary | ICD-10-CM | POA: Diagnosis not present

## 2021-04-04 DIAGNOSIS — I8390 Asymptomatic varicose veins of unspecified lower extremity: Secondary | ICD-10-CM | POA: Diagnosis not present

## 2021-04-04 DIAGNOSIS — L89322 Pressure ulcer of left buttock, stage 2: Secondary | ICD-10-CM | POA: Diagnosis not present

## 2021-04-04 DIAGNOSIS — E559 Vitamin D deficiency, unspecified: Secondary | ICD-10-CM | POA: Diagnosis not present

## 2021-04-04 DIAGNOSIS — Z681 Body mass index (BMI) 19 or less, adult: Secondary | ICD-10-CM | POA: Diagnosis not present

## 2021-04-04 DIAGNOSIS — E039 Hypothyroidism, unspecified: Secondary | ICD-10-CM | POA: Diagnosis not present

## 2021-04-04 DIAGNOSIS — G40209 Localization-related (focal) (partial) symptomatic epilepsy and epileptic syndromes with complex partial seizures, not intractable, without status epilepticus: Secondary | ICD-10-CM | POA: Diagnosis not present

## 2021-04-16 DIAGNOSIS — E039 Hypothyroidism, unspecified: Secondary | ICD-10-CM | POA: Diagnosis not present

## 2021-04-16 DIAGNOSIS — G40209 Localization-related (focal) (partial) symptomatic epilepsy and epileptic syndromes with complex partial seizures, not intractable, without status epilepticus: Secondary | ICD-10-CM | POA: Diagnosis not present

## 2021-04-16 DIAGNOSIS — E559 Vitamin D deficiency, unspecified: Secondary | ICD-10-CM | POA: Diagnosis not present

## 2021-04-16 DIAGNOSIS — Z681 Body mass index (BMI) 19 or less, adult: Secondary | ICD-10-CM | POA: Diagnosis not present

## 2021-04-16 DIAGNOSIS — R636 Underweight: Secondary | ICD-10-CM | POA: Diagnosis not present

## 2021-04-16 DIAGNOSIS — L89322 Pressure ulcer of left buttock, stage 2: Secondary | ICD-10-CM | POA: Diagnosis not present

## 2021-04-16 DIAGNOSIS — I10 Essential (primary) hypertension: Secondary | ICD-10-CM | POA: Diagnosis not present

## 2021-04-16 DIAGNOSIS — I8390 Asymptomatic varicose veins of unspecified lower extremity: Secondary | ICD-10-CM | POA: Diagnosis not present

## 2021-08-04 DIAGNOSIS — E559 Vitamin D deficiency, unspecified: Secondary | ICD-10-CM | POA: Diagnosis not present

## 2021-08-04 DIAGNOSIS — L89310 Pressure ulcer of right buttock, unstageable: Secondary | ICD-10-CM | POA: Diagnosis not present

## 2021-08-04 DIAGNOSIS — Z13220 Encounter for screening for lipoid disorders: Secondary | ICD-10-CM | POA: Diagnosis not present

## 2021-08-04 DIAGNOSIS — R634 Abnormal weight loss: Secondary | ICD-10-CM | POA: Diagnosis not present

## 2021-08-04 DIAGNOSIS — Z7689 Persons encountering health services in other specified circumstances: Secondary | ICD-10-CM | POA: Diagnosis not present

## 2021-08-04 DIAGNOSIS — G40209 Localization-related (focal) (partial) symptomatic epilepsy and epileptic syndromes with complex partial seizures, not intractable, without status epilepticus: Secondary | ICD-10-CM | POA: Diagnosis not present

## 2021-08-04 DIAGNOSIS — Z1329 Encounter for screening for other suspected endocrine disorder: Secondary | ICD-10-CM | POA: Diagnosis not present

## 2021-08-04 DIAGNOSIS — I10 Essential (primary) hypertension: Secondary | ICD-10-CM | POA: Diagnosis not present

## 2021-08-04 DIAGNOSIS — Z131 Encounter for screening for diabetes mellitus: Secondary | ICD-10-CM | POA: Diagnosis not present

## 2021-08-04 DIAGNOSIS — E039 Hypothyroidism, unspecified: Secondary | ICD-10-CM | POA: Diagnosis not present

## 2021-09-12 DIAGNOSIS — G40209 Localization-related (focal) (partial) symptomatic epilepsy and epileptic syndromes with complex partial seizures, not intractable, without status epilepticus: Secondary | ICD-10-CM | POA: Diagnosis not present

## 2021-09-15 DIAGNOSIS — Z23 Encounter for immunization: Secondary | ICD-10-CM | POA: Diagnosis not present

## 2021-09-15 DIAGNOSIS — G40209 Localization-related (focal) (partial) symptomatic epilepsy and epileptic syndromes with complex partial seizures, not intractable, without status epilepticus: Secondary | ICD-10-CM | POA: Diagnosis not present

## 2021-09-15 DIAGNOSIS — R634 Abnormal weight loss: Secondary | ICD-10-CM | POA: Diagnosis not present

## 2021-09-15 DIAGNOSIS — E871 Hypo-osmolality and hyponatremia: Secondary | ICD-10-CM | POA: Diagnosis not present

## 2021-09-15 DIAGNOSIS — E039 Hypothyroidism, unspecified: Secondary | ICD-10-CM | POA: Diagnosis not present

## 2021-09-15 DIAGNOSIS — R413 Other amnesia: Secondary | ICD-10-CM | POA: Diagnosis not present

## 2021-09-15 DIAGNOSIS — L89302 Pressure ulcer of unspecified buttock, stage 2: Secondary | ICD-10-CM | POA: Diagnosis not present

## 2021-09-15 DIAGNOSIS — I10 Essential (primary) hypertension: Secondary | ICD-10-CM | POA: Diagnosis not present

## 2021-10-30 DIAGNOSIS — E039 Hypothyroidism, unspecified: Secondary | ICD-10-CM | POA: Diagnosis not present

## 2021-10-30 DIAGNOSIS — F02A0 Dementia in other diseases classified elsewhere, mild, without behavioral disturbance, psychotic disturbance, mood disturbance, and anxiety: Secondary | ICD-10-CM | POA: Diagnosis not present

## 2021-10-30 DIAGNOSIS — L89310 Pressure ulcer of right buttock, unstageable: Secondary | ICD-10-CM | POA: Diagnosis not present

## 2021-10-30 DIAGNOSIS — I10 Essential (primary) hypertension: Secondary | ICD-10-CM | POA: Diagnosis not present

## 2021-10-30 DIAGNOSIS — L89151 Pressure ulcer of sacral region, stage 1: Secondary | ICD-10-CM | POA: Diagnosis not present

## 2021-10-30 DIAGNOSIS — E871 Hypo-osmolality and hyponatremia: Secondary | ICD-10-CM | POA: Diagnosis not present

## 2021-10-30 DIAGNOSIS — I872 Venous insufficiency (chronic) (peripheral): Secondary | ICD-10-CM | POA: Diagnosis not present

## 2021-10-30 DIAGNOSIS — G301 Alzheimer's disease with late onset: Secondary | ICD-10-CM | POA: Diagnosis not present

## 2021-10-30 DIAGNOSIS — R636 Underweight: Secondary | ICD-10-CM | POA: Diagnosis not present

## 2021-11-14 DIAGNOSIS — I10 Essential (primary) hypertension: Secondary | ICD-10-CM | POA: Diagnosis not present

## 2021-11-14 DIAGNOSIS — B351 Tinea unguium: Secondary | ICD-10-CM | POA: Diagnosis not present

## 2021-11-14 DIAGNOSIS — G301 Alzheimer's disease with late onset: Secondary | ICD-10-CM | POA: Diagnosis not present

## 2021-11-14 DIAGNOSIS — E039 Hypothyroidism, unspecified: Secondary | ICD-10-CM | POA: Diagnosis not present

## 2021-11-14 DIAGNOSIS — F02A0 Dementia in other diseases classified elsewhere, mild, without behavioral disturbance, psychotic disturbance, mood disturbance, and anxiety: Secondary | ICD-10-CM | POA: Diagnosis not present

## 2021-11-14 DIAGNOSIS — I872 Venous insufficiency (chronic) (peripheral): Secondary | ICD-10-CM | POA: Diagnosis not present

## 2021-11-14 DIAGNOSIS — L89151 Pressure ulcer of sacral region, stage 1: Secondary | ICD-10-CM | POA: Diagnosis not present

## 2021-11-14 DIAGNOSIS — R600 Localized edema: Secondary | ICD-10-CM | POA: Diagnosis not present

## 2021-11-28 DIAGNOSIS — G301 Alzheimer's disease with late onset: Secondary | ICD-10-CM | POA: Diagnosis not present

## 2021-11-28 DIAGNOSIS — L570 Actinic keratosis: Secondary | ICD-10-CM | POA: Diagnosis not present

## 2021-11-28 DIAGNOSIS — E039 Hypothyroidism, unspecified: Secondary | ICD-10-CM | POA: Diagnosis not present

## 2021-11-28 DIAGNOSIS — R600 Localized edema: Secondary | ICD-10-CM | POA: Diagnosis not present

## 2021-11-28 DIAGNOSIS — F02A0 Dementia in other diseases classified elsewhere, mild, without behavioral disturbance, psychotic disturbance, mood disturbance, and anxiety: Secondary | ICD-10-CM | POA: Diagnosis not present

## 2021-11-28 DIAGNOSIS — L89151 Pressure ulcer of sacral region, stage 1: Secondary | ICD-10-CM | POA: Diagnosis not present

## 2021-11-28 DIAGNOSIS — I739 Peripheral vascular disease, unspecified: Secondary | ICD-10-CM | POA: Diagnosis not present

## 2021-11-28 DIAGNOSIS — G40209 Localization-related (focal) (partial) symptomatic epilepsy and epileptic syndromes with complex partial seizures, not intractable, without status epilepticus: Secondary | ICD-10-CM | POA: Diagnosis not present

## 2021-11-28 DIAGNOSIS — B351 Tinea unguium: Secondary | ICD-10-CM | POA: Diagnosis not present

## 2021-11-28 DIAGNOSIS — I872 Venous insufficiency (chronic) (peripheral): Secondary | ICD-10-CM | POA: Diagnosis not present

## 2021-11-28 DIAGNOSIS — R636 Underweight: Secondary | ICD-10-CM | POA: Diagnosis not present

## 2021-11-28 DIAGNOSIS — I10 Essential (primary) hypertension: Secondary | ICD-10-CM | POA: Diagnosis not present

## 2021-12-01 DIAGNOSIS — I739 Peripheral vascular disease, unspecified: Secondary | ICD-10-CM | POA: Diagnosis not present

## 2022-08-10 ENCOUNTER — Encounter: Payer: Self-pay | Admitting: Internal Medicine

## 2022-08-10 NOTE — Progress Notes (Deleted)
[facility] Consult Note Telephone: [phone]  Fax: [phone]   Date of encounter: 08/10/22 12:24 PM PATIENT NAME: Sheri Hoffman [place] [address]   [phone] (home)  DOB: September 05, 1943 MRN: [id number] PRIMARY CARE PROVIDER:    Pcp, No,  No address on file None  REFERRING PROVIDER:   No referring provider defined for this encounter. N/A  RESPONSIBLE PARTY:    Contact Information     Name Relation Home Work [place] Spouse [phone]          I met face to face with patient and family in *** home/facility. Palliative Care was asked to follow this patient by consultation request of  No ref. provider found to address advance care planning and complex medical decision making. This is the initial visit.                                     ASSESSMENT AND PLAN / RECOMMENDATIONS:   Advance Care Planning/Goals of Care: Goals include to maximize quality of life and symptom management. Patient/health care surrogate gave his/her permission to discuss.Our advance care planning conversation included a discussion about:    The value and importance of advance care planning  Experiences with loved ones who have been seriously ill or have died  Exploration of personal, cultural or spiritual beliefs that might influence medical decisions  Exploration of goals of care in the event of a sudden injury or illness  Identification  of a healthcare agent  Review and updating or creation of an  advance directive document . Decision not to resuscitate or to de-escalate disease focused treatments due to poor prognosis. CODE STATUS:  Symptom Management/Plan:    Follow up Palliative Care Visit: Palliative care will continue to follow for complex medical decision making, advance care planning, and clarification of goals. Return ***  I spent *** minutes providing this consultation. More than 50% of the  time in this consultation was spent in counseling and care coordination.  This visit was coded based on medical decision making (MDM).***  PPS: ***0%  HOSPICE ELIGIBILITY/DIAGNOSIS: ***/***  Chief Complaint: ***  HISTORY OF PRESENT ILLNESS:  Sheri Hoffman is a 88 y.o. year old female  with *** .   History obtained from review of EMR, discussion with primary team, and interview with family, facility staff/caregiver and/or Ms. Derwin.   I reviewed available labs, medications, imaging, studies and related documents from the EMR.  Records reviewed and summarized above.   ROS  Review of Systems  Physical Exam: There were no vitals filed for this visit. There is no height or weight on file to calculate BMI. Wt Readings from Last 500 Encounters:  06/25/16 100 lb (45.4 kg)   Physical Exam  CURRENT PROBLEM LIST: There are no problems to display for this patient.  PAST MEDICAL HISTORY:  Active Ambulatory Problems    Diagnosis Date Noted   No Active Ambulatory Problems   Resolved Ambulatory Problems    Diagnosis Date Noted   No Resolved Ambulatory Problems   Past Medical History:  Diagnosis Date   Cancer ([place])    Hypertension    Seizures ([place])    Thyroid disease    SOCIAL HX:  Social History   Tobacco Use   Smoking status: Never   Smokeless tobacco: Not on file  Substance Use Topics  Alcohol use: No   FAMILY HX: No family history on file.    ALLERGIES:  Allergies  Allergen Reactions   Hydrochlorothiazide Other (See Comments)    Dropped Potassium       PERTINENT MEDICATIONS:   MED FUROSEMIDE 40 MG TABLET: TAKE 1 TABLET BY MOUTH ONCE DAILY DeMarchi, William 08/11/2022 6:00 AM MED ASPIRIN 81 MG CHEWABLE TABLET- take 1 tab po daily DeMarchi, William 08/10/2022 12:06 PM MED VITAMIN D3 50 MCG TABLET TAKE 1 TABLET BY MOUTH ONCE DAILY DeMarchi, William 08/04/2022 8:00 AM MED DOXAZOSIN MESYLATE 4 MG TAB TAKE 1 TABLET BY MOUTH EVERY MORNING WITH BREAKFAST X  30DAYS DeMarchi, William 08/04/2022 8:00 AM MED LOSARTAN POTASSIUM 25 MG TAB TAKE 1 TABLET BY MOUTH ONCE DAILY X 30DAYS DeMarchi, William 08/04/2022 8:00 AM MED LEVOTHYROXINE 75 MCG TABLET TAKE 1 TABLET BY MOUTH EVERY MORNING DeMarchi, William 08/04/2022 6:00 AM MED MAGNESIUM OXIDE 400 MG TABLET TAKE 1 TABLET BY MOUTH TWICE A DAY DeMarchi, William 08/03/2022 8:00 PM MED SODIUM CHLORIDE 1 GM TABLET TAKE 1 TABLET BY MOUTH THREE TIMES A DAY DeMarchi, William 08/03/2022 8:00 PM MED CETAPHIL MOISTURIZING CREAM APPLY TO BOTH LEGS TWICE A DAY DeMarchi, William 08/03/2022 8:00 PM MED LEVETIRACETAM 500 MG TABLET TAKE 1 TABLET BY MOUTH TWICE A DAY DeMarchi, William 08/03/2022 8:00 PM MED ATORVASTATIN 40 MG TABLET TAKE 1 TABLET BY MOUTH ONCE A DAY AT 6PM X 30DAYS DeMarchi, William 08/03/2022 6:00 PM MED CARVEDILOL 6.25 MG TABLET TAKE 1 TABLET BY MOUTH TWICE A DAY WITH MEALS DeMarchi, Gwyndolyn Saxon 08/03/2022 6:00 PM MED Constipation (3 of 4): If not relieved by Biscodyl suppository, give disposable Saline Enema rectally X 1 dose/24 hrs as needed (Do not use constipation standing orders for residents with renal failure/CFR less than 30. Contact MD for orders)(Physician Or Madison Hickman 08/02/2022 1:07 PM MED Constipation (1 of 4): If no BM in 3 days, give 30 cc Milk of Magnesium p.o. x 1 dose in 24 hours as needed (Do not use standing constipation orders for residents with renal failure CFR less than 30. Contact MD for orders) (Physician Order) Madison Hickman 08/02/2022 1:06 PM MED Constipation (2 of 4): If not relieved by MOM, give 10 mg Bisacodyl suppositiory rectally X 1 dose in 24 hours as needed (Do not use constipation standing orders for residents with renal failure/CFR less than 30. Contact MD for orders) (Physician Order) Madison Hickman 08/02/2022 1:0  Thank you for the opportunity to participate in the care of Ms. Sweeny.  The palliative care team will continue to  follow. Please call our office at [phone] if we can be of additional assistance.   Hollace Kinnier, DO  COVID-19 PATIENT SCREENING TOOL Asked and negative response unless otherwise noted:  Have you had symptoms of covid, tested positive or been in contact with someone with symptoms/positive test in the past 5-10 days?  NO

## 2022-08-13 ENCOUNTER — Inpatient Hospital Stay (HOSPITAL_COMMUNITY)
Admission: EM | Admit: 2022-08-13 | Discharge: 2022-08-17 | DRG: 189 | Disposition: E | Payer: PPO | Source: Skilled Nursing Facility | Attending: Internal Medicine | Admitting: Internal Medicine

## 2022-08-13 ENCOUNTER — Other Ambulatory Visit: Payer: Self-pay

## 2022-08-13 ENCOUNTER — Emergency Department (HOSPITAL_COMMUNITY): Payer: PPO

## 2022-08-13 ENCOUNTER — Observation Stay (HOSPITAL_COMMUNITY): Payer: PPO

## 2022-08-13 ENCOUNTER — Encounter (HOSPITAL_COMMUNITY): Payer: Self-pay

## 2022-08-13 DIAGNOSIS — Z79891 Long term (current) use of opiate analgesic: Secondary | ICD-10-CM

## 2022-08-13 DIAGNOSIS — R638 Other symptoms and signs concerning food and fluid intake: Secondary | ICD-10-CM | POA: Diagnosis not present

## 2022-08-13 DIAGNOSIS — G40909 Epilepsy, unspecified, not intractable, without status epilepticus: Secondary | ICD-10-CM | POA: Diagnosis present

## 2022-08-13 DIAGNOSIS — E878 Other disorders of electrolyte and fluid balance, not elsewhere classified: Secondary | ICD-10-CM | POA: Diagnosis present

## 2022-08-13 DIAGNOSIS — J9602 Acute respiratory failure with hypercapnia: Principal | ICD-10-CM

## 2022-08-13 DIAGNOSIS — Z7982 Long term (current) use of aspirin: Secondary | ICD-10-CM | POA: Diagnosis not present

## 2022-08-13 DIAGNOSIS — L89302 Pressure ulcer of unspecified buttock, stage 2: Secondary | ICD-10-CM | POA: Diagnosis present

## 2022-08-13 DIAGNOSIS — E860 Dehydration: Secondary | ICD-10-CM | POA: Diagnosis present

## 2022-08-13 DIAGNOSIS — Z66 Do not resuscitate: Secondary | ICD-10-CM

## 2022-08-13 DIAGNOSIS — Z79890 Hormone replacement therapy (postmenopausal): Secondary | ICD-10-CM

## 2022-08-13 DIAGNOSIS — R531 Weakness: Secondary | ICD-10-CM

## 2022-08-13 DIAGNOSIS — E8729 Other acidosis: Secondary | ICD-10-CM | POA: Diagnosis present

## 2022-08-13 DIAGNOSIS — I10 Essential (primary) hypertension: Secondary | ICD-10-CM | POA: Diagnosis present

## 2022-08-13 DIAGNOSIS — E861 Hypovolemia: Secondary | ICD-10-CM | POA: Diagnosis present

## 2022-08-13 DIAGNOSIS — Z7189 Other specified counseling: Secondary | ICD-10-CM

## 2022-08-13 DIAGNOSIS — E875 Hyperkalemia: Secondary | ICD-10-CM

## 2022-08-13 DIAGNOSIS — Z9981 Dependence on supplemental oxygen: Secondary | ICD-10-CM

## 2022-08-13 DIAGNOSIS — E039 Hypothyroidism, unspecified: Secondary | ICD-10-CM

## 2022-08-13 DIAGNOSIS — R54 Age-related physical debility: Secondary | ICD-10-CM | POA: Diagnosis present

## 2022-08-13 DIAGNOSIS — R0602 Shortness of breath: Secondary | ICD-10-CM | POA: Diagnosis not present

## 2022-08-13 DIAGNOSIS — E871 Hypo-osmolality and hyponatremia: Secondary | ICD-10-CM

## 2022-08-13 DIAGNOSIS — Z515 Encounter for palliative care: Secondary | ICD-10-CM

## 2022-08-13 DIAGNOSIS — I6930 Unspecified sequelae of cerebral infarction: Secondary | ICD-10-CM | POA: Diagnosis not present

## 2022-08-13 DIAGNOSIS — R4182 Altered mental status, unspecified: Secondary | ICD-10-CM

## 2022-08-13 DIAGNOSIS — Z888 Allergy status to other drugs, medicaments and biological substances status: Secondary | ICD-10-CM

## 2022-08-13 DIAGNOSIS — Z883 Allergy status to other anti-infective agents status: Secondary | ICD-10-CM

## 2022-08-13 DIAGNOSIS — Z711 Person with feared health complaint in whom no diagnosis is made: Secondary | ICD-10-CM

## 2022-08-13 DIAGNOSIS — L8990 Pressure ulcer of unspecified site, unspecified stage: Secondary | ICD-10-CM | POA: Insufficient documentation

## 2022-08-13 DIAGNOSIS — Z8669 Personal history of other diseases of the nervous system and sense organs: Secondary | ICD-10-CM | POA: Diagnosis not present

## 2022-08-13 DIAGNOSIS — Z85828 Personal history of other malignant neoplasm of skin: Secondary | ICD-10-CM

## 2022-08-13 DIAGNOSIS — G9341 Metabolic encephalopathy: Secondary | ICD-10-CM | POA: Diagnosis present

## 2022-08-13 DIAGNOSIS — Z79899 Other long term (current) drug therapy: Secondary | ICD-10-CM | POA: Diagnosis not present

## 2022-08-13 DIAGNOSIS — J9601 Acute respiratory failure with hypoxia: Secondary | ICD-10-CM

## 2022-08-13 DIAGNOSIS — Z789 Other specified health status: Secondary | ICD-10-CM

## 2022-08-13 DIAGNOSIS — E8809 Other disorders of plasma-protein metabolism, not elsewhere classified: Secondary | ICD-10-CM | POA: Diagnosis present

## 2022-08-13 DIAGNOSIS — G9340 Encephalopathy, unspecified: Secondary | ICD-10-CM

## 2022-08-13 DIAGNOSIS — Z885 Allergy status to narcotic agent status: Secondary | ICD-10-CM

## 2022-08-13 HISTORY — DX: Cerebral infarction, unspecified: I63.9

## 2022-08-13 LAB — I-STAT VENOUS BLOOD GAS, ED
Acid-Base Excess: 11 mmol/L — ABNORMAL HIGH (ref 0.0–2.0)
Bicarbonate: 40.8 mmol/L — ABNORMAL HIGH (ref 20.0–28.0)
Calcium, Ion: 1.13 mmol/L — ABNORMAL LOW (ref 1.15–1.40)
HCT: 36 % (ref 36.0–46.0)
Hemoglobin: 12.2 g/dL (ref 12.0–15.0)
O2 Saturation: 73 %
Potassium: 6.1 mmol/L — ABNORMAL HIGH (ref 3.5–5.1)
Sodium: 131 mmol/L — ABNORMAL LOW (ref 135–145)
TCO2: 44 mmol/L — ABNORMAL HIGH (ref 22–32)
pCO2, Ven: 88.9 mmHg (ref 44–60)
pH, Ven: 7.27 (ref 7.25–7.43)
pO2, Ven: 46 mmHg — ABNORMAL HIGH (ref 32–45)

## 2022-08-13 LAB — COMPREHENSIVE METABOLIC PANEL
ALT: 28 U/L (ref 0–44)
ALT: 30 U/L (ref 0–44)
AST: 26 U/L (ref 15–41)
AST: 28 U/L (ref 15–41)
Albumin: 2.4 g/dL — ABNORMAL LOW (ref 3.5–5.0)
Albumin: 2.3 g/dL — ABNORMAL LOW (ref 3.5–5.0)
Alkaline Phosphatase: 67 U/L (ref 38–126)
Alkaline Phosphatase: 65 U/L (ref 38–126)
Anion gap: 3 — ABNORMAL LOW (ref 5–15)
Anion gap: 5 (ref 5–15)
BUN: 26 mg/dL — ABNORMAL HIGH (ref 8–23)
BUN: 25 mg/dL — ABNORMAL HIGH (ref 8–23)
CO2: 41 mmol/L — ABNORMAL HIGH (ref 22–32)
CO2: 34 mmol/L — ABNORMAL HIGH (ref 22–32)
Calcium: 8.4 mg/dL — ABNORMAL LOW (ref 8.9–10.3)
Calcium: 8 mg/dL — ABNORMAL LOW (ref 8.9–10.3)
Chloride: 89 mmol/L — ABNORMAL LOW (ref 98–111)
Chloride: 95 mmol/L — ABNORMAL LOW (ref 98–111)
Creatinine, Ser: 0.76 mg/dL (ref 0.44–1.00)
Creatinine, Ser: 0.75 mg/dL (ref 0.44–1.00)
GFR, Estimated: >60 mL/min (ref >60)
GFR, Estimated: >60 mL/min (ref >60)
Glucose, Bld: 100 mg/dL — ABNORMAL HIGH (ref 70–99)
Glucose, Bld: 95 mg/dL (ref 70–99)
Potassium: 6.6 mmol/L (ref 3.5–5.1)
Potassium: 6.6 mmol/L (ref 3.5–5.1)
Sodium: 133 mmol/L — ABNORMAL LOW (ref 135–145)
Sodium: 134 mmol/L — ABNORMAL LOW (ref 135–145)
Total Bilirubin: 0.1 mg/dL — ABNORMAL LOW (ref 0.3–1.2)
Total Bilirubin: 0.5 mg/dL (ref 0.3–1.2)
Total Protein: 5.3 g/dL — ABNORMAL LOW (ref 6.5–8.1)
Total Protein: 5.3 g/dL — ABNORMAL LOW (ref 6.5–8.1)

## 2022-08-13 LAB — CK: Total CK: 74 U/L (ref 38–234)

## 2022-08-13 LAB — CBC WITH DIFFERENTIAL/PLATELET
Abs Immature Granulocytes: 0.11 10*3/uL — ABNORMAL HIGH (ref 0.00–0.07)
Basophils Absolute: 0 10*3/uL (ref 0.0–0.1)
Basophils Relative: 0 %
Eosinophils Absolute: 0 10*3/uL (ref 0.0–0.5)
Eosinophils Relative: 0 %
HCT: 36.6 % (ref 36.0–46.0)
Hemoglobin: 11.3 g/dL — ABNORMAL LOW (ref 12.0–15.0)
Immature Granulocytes: 1 %
Lymphocytes Relative: 7 %
Lymphs Abs: 0.7 10*3/uL (ref 0.7–4.0)
MCH: 30.9 pg (ref 26.0–34.0)
MCHC: 30.9 g/dL (ref 30.0–36.0)
MCV: 100 fL (ref 80.0–100.0)
Monocytes Absolute: 0.4 10*3/uL (ref 0.1–1.0)
Monocytes Relative: 4 %
Neutro Abs: 8 10*3/uL — ABNORMAL HIGH (ref 1.7–7.7)
Neutrophils Relative %: 88 %
Platelets: 234 10*3/uL (ref 150–400)
RBC: 3.66 MIL/uL — ABNORMAL LOW (ref 3.87–5.11)
RDW: 14.3 % (ref 11.5–15.5)
WBC: 9.2 10*3/uL (ref 4.0–10.5)
nRBC: 0 % (ref 0.0–0.2)

## 2022-08-13 LAB — BLOOD GAS, VENOUS
Acid-Base Excess: 10 mmol/L — ABNORMAL HIGH (ref 0.0–2.0)
Bicarbonate: 41.2 mmol/L — ABNORMAL HIGH (ref 20.0–28.0)
Drawn by: 61317
O2 Saturation: 64.4 %
Patient temperature: 34.9
pCO2, Ven: 86 mmHg (ref 44–60)
pH, Ven: 7.28 (ref 7.25–7.43)
pO2, Ven: <31 mmHg — CL (ref 32–45)

## 2022-08-13 LAB — TROPONIN I (HIGH SENSITIVITY): Troponin I (High Sensitivity): 22 ng/L — ABNORMAL HIGH (ref <18)

## 2022-08-13 LAB — LACTIC ACID, PLASMA: Lactic Acid, Venous: 0.7 mmol/L (ref 0.5–1.9)

## 2022-08-13 MED ORDER — SODIUM CHLORIDE 0.9% FLUSH
3.0000 mL | Freq: Two times a day (BID) | INTRAVENOUS | Status: DC
Start: 2022-08-13 — End: 2022-08-14
  Administered 2022-08-13: 3 mL via INTRAVENOUS

## 2022-08-13 MED ORDER — ONDANSETRON HCL 4 MG/2ML IJ SOLN: 4.0000 mg | Freq: Four times a day (QID) | INTRAMUSCULAR | Status: DC | PRN

## 2022-08-13 MED ORDER — LACTATED RINGERS IV BOLUS: 1000.0000 mL | Freq: Once | INTRAVENOUS | Status: DC

## 2022-08-13 MED ORDER — ENOXAPARIN SODIUM 40 MG/0.4ML IJ SOSY: 40.0000 mg | PREFILLED_SYRINGE | INTRAMUSCULAR | Status: DC

## 2022-08-13 MED ORDER — SODIUM CHLORIDE 0.9 % IV BOLUS
500.0000 mL | Freq: Once | INTRAVENOUS | Status: AC
  Administered 2022-08-13: 500 mL via INTRAVENOUS

## 2022-08-13 MED ORDER — ALBUTEROL SULFATE (2.5 MG/3ML) 0.083% IN NEBU: 2.5000 mg | INHALATION_SOLUTION | Freq: Four times a day (QID) | RESPIRATORY_TRACT | Status: DC | PRN

## 2022-08-13 MED ORDER — ENOXAPARIN SODIUM 30 MG/0.3ML IJ SOSY
30.0000 mg | PREFILLED_SYRINGE | INTRAMUSCULAR | Status: DC
  Administered 2022-08-13: 30 mg via SUBCUTANEOUS
  Filled 2022-08-13: qty 0.3

## 2022-08-13 MED ORDER — LEVETIRACETAM IN NACL 500 MG/100ML IV SOLN
500.0000 mg | Freq: Two times a day (BID) | INTRAVENOUS | Status: DC
  Administered 2022-08-13 (×2): 500 mg via INTRAVENOUS
  Filled 2022-08-13 (×2): qty 100

## 2022-08-13 MED ORDER — SODIUM CHLORIDE 0.9 % IV SOLN: INTRAVENOUS | Status: DC

## 2022-08-13 MED ORDER — ACETAMINOPHEN 650 MG RE SUPP: 650.0000 mg | Freq: Four times a day (QID) | RECTAL | Status: DC | PRN

## 2022-08-13 MED ORDER — SODIUM CHLORIDE 0.9 % IV BOLUS
1000.0000 mL | Freq: Once | INTRAVENOUS | Status: AC
  Administered 2022-08-13: 1000 mL via INTRAVENOUS

## 2022-08-13 MED ORDER — ACETAMINOPHEN 325 MG PO TABS: 650.0000 mg | ORAL_TABLET | Freq: Four times a day (QID) | ORAL | Status: DC | PRN

## 2022-08-13 MED ORDER — ONDANSETRON HCL 4 MG PO TABS: 4.0000 mg | ORAL_TABLET | Freq: Four times a day (QID) | ORAL | Status: DC | PRN

## 2022-08-13 NOTE — ED Notes (Signed)
Pt transported to MRI 

## 2022-08-13 NOTE — H&P (Signed)
History and Physical    Patient: Sheri Hoffman FYB:[id number] DOB: [birth year] DOA: 07/30/2022 DOS: the patient was seen and examined on 08/06/2022 PCP: Pcp, No  Patient coming from: [facility] via EMS  Chief Complaint:  Chief Complaint  Patient presents with   Altered Mental Status   HPI: Sheri Hoffman is a 88 y.o. female with medical history significant of hypertension, CVA, seizure disorder, and hypothyroid who presented from nursing facility due to change in breathing and altered mental status.  History is obtained from review of records and talks with the patient's son and daughter-in-law at bedside.  She had just recently been hospitalized at that [facility] from 8/10-8/17 after a fall found to have multifocal acute small nonhemorrhagic cortical infarcts involving the right occipital lobe, left cingulate gyrus, and right middle lobe frontal gyrus suggestive of embolic source.  As result of the stroke patient was on a pured diet with nectar thick liquids initially, but since being sent and informed had transition to a thin diet.  She has been needing assistance to sit, stand, and walk about 5 feet.  Family notes she has been able to communicate with them although slowed in doing so.  This morning they were notified by staff that she was not responding to them like normal and had labored breathing.  Family and note that she has a DNR in place which should remain.   Upon admission to the emergency department patient was found to have a temperature of 96 F with heart rate 59-71, respirations 13-29, blood pressures as low as 101/44-128/50, and O2 saturations currently maintained on 2L of [place].  Labs revealed hemoglobin 11.3, hemoglobin 133, potassium 6.6, chloride 89, CO2 41, BUN 26,  creatinine 0.76, calcium 8.3, and lactic acid 0.7. CT scan of the head did not note any acute abnormality or significant change from prior.  Patient has been given 2 L of normal saline IV  fluids.  Review of Systems: As mentioned in the history of present illness. All other systems reviewed and are negative. Past Medical History:  Diagnosis Date   Cancer ([place])    skin cancer   Hypertension    Seizures ([place])    possible, pt is on medication to prevent seizures   Stroke [facility])    Thyroid disease    Past Surgical History:  Procedure Laterality Date   SKIN BIOPSY     TONSILLECTOMY     Social History:  reports that she has never smoked. She does not have any smokeless tobacco history on file. She reports that she does not drink alcohol and does not use drugs.  Allergies  Allergen Reactions   Iodine Rash   Nifedipine Swelling   Thiazide-Type Diuretics Other (See Comments)    Hypoatremia (severe)   Escitalopram Other (See Comments)    Delusions   Neomycin-Polymyxin-Hc Other (See Comments)    Local reaction   Chlorthalidone Other (See Comments)    Reaction not listed on MAR   Hydrochlorothiazide Other (See Comments)    Dropped Potassium    Hydrocodone Nausea And Vomiting    History reviewed. No pertinent family history.  Prior to Admission medications   Medication Sig Start Date End Date Taking? Authorizing Provider  aspirin 81 MG chewable tablet Chew 81 mg by mouth daily.   Yes [provider]  atorvastatin (LIPITOR) 40 MG tablet Take 40 mg by mouth daily.   Yes [provider]  bisacodyl (DULCOLAX) 10 MG suppository Place 10 mg  rectally as needed for moderate constipation (if constipation is not relieved by MOM).   Yes [provider]  carvedilol (COREG) 6.25 MG tablet Take 6.25 mg by mouth 2 (two) times daily with a meal.   Yes [provider]  Cholecalciferol (VITAMIN D3) 50 MCG (2000 UT) capsule Take 2,000 Units by mouth daily.   Yes [provider]  doxazosin (CARDURA XL) 4 MG 24 hr tablet Take 4 mg by mouth daily with breakfast.   Yes [provider]  Emollient (CETAPHIL) cream Apply 1 Application  topically 2 (two) times daily. Apply to both legs   Yes [provider]  furosemide (LASIX) 40 MG tablet Take 40 mg by mouth daily.   Yes [provider]  levETIRAcetam (KEPPRA) 500 MG tablet Take 500 mg by mouth 2 (two) times daily.   Yes [provider]  levothyroxine (SYNTHROID) 75 MCG tablet Take 75 mcg by mouth daily before breakfast.   Yes [provider]  losartan (COZAAR) 25 MG tablet Take 25 mg by mouth daily.   Yes [provider]  Magnesium Hydroxide (MILK OF MAGNESIA PO) Take 30 mLs by mouth as needed (if no BM in 3 days).   Yes [provider]  magnesium oxide (MAG-OX) 400 (240 Mg) MG tablet Take 400 mg by mouth 2 (two) times daily.   Yes [provider]  Nutritional Supplements (NUTRITIONAL DRINK) LIQD Take 120 mLs by mouth in the morning and at bedtime.   Yes [provider]  OXYGEN Inhale into the lungs continuous. 2 L   Yes [provider]  Pollen Extracts (PROSTAT PO) Take 30 mLs by mouth daily.   Yes [provider]  sodium chloride 1 g tablet Take 1 g by mouth 3 (three) times daily.   Yes [provider]  Sodium Phosphates (RA SALINE ENEMA RE) Place 1 Dose rectally as needed (if constipation is not relieved by suppository).   Yes [provider]  amLODipine (NORVASC) 5 MG tablet Take 2.5 mg by mouth at bedtime. Patient not taking: Reported on 08/07/2022    [provider]  amoxicillin-clavulanate (AUGMENTIN) 875-125 MG tablet Take 1 tablet by mouth 2 (two) times daily. Patient not taking: Reported on 08/16/2022    [provider]  carvedilol (COREG) 25 MG tablet Take 12.5 mg by mouth daily.  Patient not taking: Reported on 07/17/2022    [provider]  furosemide (LASIX) 20 MG tablet Take 20 mg by mouth daily. Patient not taking: Reported on 07/29/2022    [provider]  levothyroxine (SYNTHROID, LEVOTHROID) 88 MCG tablet Take 88 mcg by  mouth daily. Patient not taking: Reported on 08/09/2022    [provider]  traMADol (ULTRAM) 50 MG tablet Take 1 tablet (50 mg total) by mouth every 6 (six) hours as needed for severe pain. Patient not taking: Reported on 07/28/2022 06/26/16   Veatrice Kells, MD    Physical Exam: Vitals:   07/31/2022 1245 07/21/2022 1300 08/02/2022 1314 08/03/2022 1315  BP: (!) 121/46 (!) 126/53  (!) 124/55  Pulse: 60 (!) 59  61  Resp: (!) 21 20  (!) 26  Temp:   (!) 96 F (35.6 C)   TempSrc:   Axillary   SpO2: 92% 99%  98%  Weight:      Height:       Exam  Constitutional: Chronically ill-appearing elderly female who is minimally responsive and not readily following commands. Eyes: Constricted pupils. ENMT: Mucous membranes are  dry.   Fair dentition. Neck: normal, supple  Respiratory: Tachypneic currently on 2 L nasal cannula oxygen with O2 saturations maintained. Cardiovascular: Regular rate and rhythm, no murmurs / rubs / gallops. No extremity edema.   Abdomen: no tenderness, no masses palpated.  Bowel sounds positive.  Musculoskeletal: no clubbing / cyanosis.  No focal joint deformity appreciated. Skin: Wounds noted to the bilateral lower extremities.  Pressure ulcer of the sacrum. Neurologic: Unable to assess at this time. Psychiatric: Lethargic, unable to assess.  Data Reviewed:  EKG reveals sinus rhythm at 67 bpm with old inferior infarct  Assessment and Plan:  Acute metabolic encephalopathy Patient was noted to be acutely altered with change in breathing.  CT scan of the brain did not note any acute abnormality.  Patient does have prior history of possible seizures for which she is on Keppra.  Cause of patient's symptoms is not totally clear at this time question stroke  vs. seizure vs. hypercapnia given elevated CO2. -Admit to a telemetry bed -Neurochecks -N.p.o. -Check venous blood gas -Stat EEG -Check MRI of the brain -Follow-up urinalysis -Palliative care consulted, to help  establish goals of care  Hyperkalemia Acute. Initial potassium 6.6.  EKG without significant changes appreciated.  Patient received 2 L of IV fluids. -Continue IV fluids -Follow-up repeat check of potassium level  History of CVA with residual deficit Patient was just seen at [facility] and found to have multifocal acute small nonhemorrhagic cortical infarcts involving the right occipital lobe, left cingulate gyrus, and right middle lobe frontal gyrus suggestive of embolic source.  She had been on a pured diet after the stroke, and had just recently been advanced to thin liquids. -Follow-up MRI -Consider speech therapy consulted in a.m. if patient more alert  DehydrationHyponatremia Acute.  Patient was noted to have sodium 133.  Given dysphagia diet and elevated BUN to creatinine ratio greater than 20 is suggest of dehydration.  Patient has been given 2 L of IV fluids in the ED. -Continue normal saline IV fluids at 50 mL/h  History of seizure disorder Home medication regimen includes Keppra 500 mg twice daily. -Change Keppra to IV twice daily until able to tolerate p.o.   Hypothyroidism TSH 4.74 on 8/10.  Home medication regimen includes levothyroxine 75 mcg daily. -Resume when patient able to tolerate p.o.  Hypoalbuminemia Acute.  Albumin 2.4 on admission.  Suspect likely secondary to above.  DVT prophylaxis: Lovenox Advance Care Planning:   Code Status: DNR confirmed with family at bedside.  Consults: Palliative care consult  Family Communication: Son and daughter-in-law updated at bedside  Severity of Illness: The appropriate patient status for this patient is OBSERVATION. Observation status is judged to be reasonable and necessary in order to provide the required intensity of service to ensure the patient's safety. The patient's presenting symptoms, physical exam findings, and initial radiographic and laboratory data in the context of their medical  condition is felt to place them at decreased risk for further clinical deterioration. Furthermore, it is anticipated that the patient will be medically stable for discharge from the hospital within 2 midnights of admission.   Author: Norval Morton, MD 08/12/2022 1:18 PM  For on call review [email or web address].

## 2022-08-13 NOTE — ED Notes (Signed)
EEG at bedside.

## 2022-08-13 NOTE — Progress Notes (Signed)
RT arrived to patients room to place on bipap per Dr Thompson Caul order. Patient had left for MRI. RN received a phone call that patient's MRI was not going to be done since family wasn't with her. RT waited on patient to return to the room and placed her on bipap.

## 2022-08-13 NOTE — Procedures (Signed)
Patient Name: Sheri Hoffman  MRN: [id number]  Epilepsy Attending: Lora Havens  Referring Physician/Provider: Norval Morton, MD  Date: 08/12/2022  Duration: 22.27 mins  Patient history: 88yo F with h/o seizure now with ams. EEG to evaluate to seizure.   Level of alertness: Awake  AEDs during EEG study: LEV  Technical aspects: This EEG study was done with scalp electrodes positioned according to the 10-20 International system of electrode placement. Electrical activity was reviewed with band pass filter of 1-'70Hz'$ , sensitivity of 7 uV/mm, display speed of 95m/sec with a '60Hz'$  notched filter applied as appropriate. EEG data were recorded continuously and digitally stored.  Video monitoring was available and reviewed as appropriate.  Description: No posterior dominant rhythm was seen. EEG showed continuous generalized polymorphic sharply contoured 3 to 5 Hz theta-delta slowing. Hyperventilation and photic stimulation were not performed.     ABNORMALITY - Continuous slow, generalized  IMPRESSION: This study is suggestive of moderate diffuse encephalopathy, nonspecific etiology. No seizures or epileptiform discharges were seen throughout the recording.  Bird Tailor OBarbra Sarks

## 2022-08-13 NOTE — ED Triage Notes (Signed)
Patient from [place] went to [facility] 10 days ago for stroke. Patient is oriented x 1 and nonverbal.  Facility wanted her sent out for change in breathing, swallowing and mental status.  Patient has a DNR and is under pallative care with [facility]. Patient has blank stare and guppy breathing. Wound to buttocks pta

## 2022-08-13 NOTE — Progress Notes (Signed)
EEG complete - results pending 

## 2022-08-13 NOTE — Consult Note (Signed)
Consultation Note Date: 08/11/2022   Patient Name: Sheri Hoffman  DOB: [birth year]  MRN: [id number]  Age / Sex: 88 y.o., female  PCP: Pcp, No Referring Physician: Norval Morton, MD  Reason for Consultation: Establishing goals of care, "Patient with recent stroke.  Discussed goals of care"  HPI/Patient Profile: 88 y.o. female  with past medical history of  hypertension, CVA, seizure disorder, and hypothyroidism presented to ED on 07/25/2022 from [facility] with staff concerns of her change of breathing and altered mental status.  Patient was recently hospitalized at [facility] from 8/10 - 08/02/2022 after a fall found to have had a stroke.  Patient was admitted admitted on 08/12/2022 with acute metabolic encephalopathy, hyperkalemia, history of CVA with residual deficit, dehydration, hypoalbuminemia, history of seizure disorder.    Clinical Assessment and Goals of Care: I have reviewed medical records including EPIC notes, labs, and imaging. Received report from primary RN -no acute concerns.  RN reports that patient is nonverbal, not eating or drinking, is able to track movements with her eyes.  Went to visit patient at bedside -son/Sheri Hoffman and daughter-in-law/Sheri Hoffman present. Patient was lying in bed awake, nonverbal, and unable to participate in conversation. No signs or non-verbal gestures of pain or discomfort noted. No respiratory distress, increased work of breathing, or secretions noted.  Patient is tachypneic.  She is very ill and frail appearing.  Jaw is relaxed open.  Met with Sheri Hoffman and Sheri Hoffman to discuss diagnosis, prognosis, GOC, EOL wishes, disposition, and options.  I introduced Palliative Medicine as specialized medical care for people living with serious illness. It focuses on providing relief from the symptoms and stress of a serious illness. The goal is to improve quality of life for both  the patient and the family.  We discussed a brief life review of the patient as well as functional and nutritional status.  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  She is widowed -she has 1 daughter and 1 son.  Prior to hospitalization earlier this month she was living in a private residence alone.  She was able to walk with a cane; however, was supposed to be using a walker.  Family tell me that she "did fairly well" ambulating.  Her only home health service was [facility].  Overall they reported she was "doing fair" by herself.  After the hospitalization earlier this month after her stroke, she was discharged to [facility].  Family tell me that they have seen her have "slight improvement."  She could sit on the edge of the bed with assistance, transfer from bed to wheelchair with assistance, ambulate about 5 feet with walker with assistance.  After patient stroke she was put on a pured diet -family feel that she did not "like this very much" as they noted she was not eating as well as she had been before.  Albumin noted at 2.4 on 08/02/2022.  Reviewed pured diet in context of usual textures/taste and how she may no longer find joy in this new diet.  We discussed patient's current illness and what it means in the larger context of patient's on-going co-morbidities.  Family understand that at this time we are unclear if patient has had another stroke versus seizure versus altered labs.  Natural disease trajectory and expectations at EOL were discussed. I attempted to elicit values and goals of care important to the patient. The difference between aggressive medical intervention and comfort care was considered  in light of the patient's goals of care.  Family tell me that patient has been telling them that she is "ready to go" for the last several months.  They endorse that her quality of life has significantly declined and she "does not do things she used to enjoy."  We talked  about transition to comfort measures in house and what that would entail inclusive of medications to control pain, dyspnea, agitation, nausea, and itching. We discussed stopping all unnecessary measures such as blood draws, needle sticks, oxygen, antibiotics, CBGs/insulin, cardiac monitoring, IVF, and frequent vital signs.  Provided education and counseling at length on the philosophy and benefits of hospice care. Discussed that it offers a holistic approach to care in the setting of end-stage illness, and is about supporting the patient where they are allowing nature to take it's course. Discussed the hospice team includes RNs, physicians, social workers, and chaplains. They can provide personal care, support for the family, and help keep patient out of the hospital as well as assist with DME needs for home hospice. Education provided on the difference between home vs residential hospice.   Sheri Hoffman confirms that he and his wife are both patient's HCPOA - requested copy if able.  Family do feel patient would not want her life prolonged and she would be ready for comfort measures; however, Sheri Hoffman would like to discuss this with his sister prior to making any final decisions.  At this time, family are okay with current gentle medical interventions including but not limited to EEG, MRI brain, labs, fluids, cardiac monitoring.  In the event of a decline they would not wish to escalate her care.  Family are open for PMT follow-up tomorrow for any further thoughts/decisions on transition to full comfort/hospice care.  Durable DNR form noted at patient's bedside.  Discussed with family the importance of continued conversation with each other and the medical providers regarding overall plan of care and treatment options, ensuring decisions are within the context of the patient's values and GOCs.    Questions and concerns were addressed. The patient/family was encouraged to call with questions and/or concerns. PMT  card was provided.   Primary Decision Maker: HCPOA -son/Sheri Hoffman - no documents on file    SUMMARY OF RECOMMENDATIONS   Continue current gentle medical interventions.  In the event of a decline family would not wish to escalate patient's care Continue DNR/DNI as previously documented Family considering patient's transition to full comfort/hospice care - son/HCPOA to discuss this with his sister tonight.  They are open for PMT follow-up tomorrow 8/29 PMT will continue to follow and support holistically   Code Status/Advance Care Planning: DNR  Palliative Prophylaxis:  Aspiration, Bowel Regimen, Delirium Protocol, Frequent Pain Assessment, Oral Care, and Turn Reposition  Additional Recommendations (Limitations, Scope, Preferences): Minimize Medications, Full Scope Treatment, and No Tracheostomy  Psycho-social/Spiritual:  Desire for further Chaplaincy support:no Created space and opportunity for patient and family to express thoughts and feelings regarding patient's current medical situation.  Emotional support and therapeutic listening provided.  Prognosis:  < 2 weeks  Discharge Planning: To Be Determined      Primary Diagnoses: Present on Admission:  Acute metabolic encephalopathy   I have reviewed the medical record, interviewed the patient and family, and examined the patient. The following aspects are pertinent.  Past Medical History:  Diagnosis Date   Cancer ([place])    skin cancer   Hypertension    Seizures ([place])    possible, pt is on medication to  prevent seizures   Stroke [facility])    Thyroid disease    Social History   Socioeconomic History   Marital status: Married    Spouse name: Not on file   Number of children: Not on file   Years of education: Not on file   Highest education level: Not on file  Occupational History   Not on file  Tobacco Use   Smoking status: Never   Smokeless tobacco: Not on file  Substance and Sexual Activity   Alcohol use: No    Drug use: No   Sexual activity: Not on file  Other Topics Concern   Not on file  Social History Narrative   Not on file   Social Determinants of Health   Financial Resource Strain: Not on file  Food Insecurity: Not on file  Transportation Needs: Not on file  Physical Activity: Not on file  Stress: Not on file  Social Connections: Not on file   History reviewed. No pertinent family history. Scheduled Meds:  enoxaparin (LOVENOX) injection  30 mg Subcutaneous Q24H   sodium chloride flush  3 mL Intravenous Q12H   Continuous Infusions:  sodium chloride 50 mL/hr at 08/02/2022 1622   levETIRAcetam Stopped (07/22/2022 1622)   PRN Meds:.acetaminophen **OR** acetaminophen, albuterol, ondansetron **OR** ondansetron (ZOFRAN) IV Medications Prior to Admission:  Prior to Admission medications   Medication Sig Start Date End Date Taking? Authorizing Provider  aspirin 81 MG chewable tablet Chew 81 mg by mouth daily.   Yes [provider]  atorvastatin (LIPITOR) 40 MG tablet Take 40 mg by mouth daily.   Yes [provider]  bisacodyl (DULCOLAX) 10 MG suppository Place 10 mg rectally as needed for moderate constipation (if constipation is not relieved by MOM).   Yes [provider]  carvedilol (COREG) 6.25 MG tablet Take 6.25 mg by mouth 2 (two) times daily with a meal.   Yes [provider]  Cholecalciferol (VITAMIN D3) 50 MCG (2000 UT) capsule Take 2,000 Units by mouth daily.   Yes [provider]  doxazosin (CARDURA XL) 4 MG 24 hr tablet Take 4 mg by mouth daily with breakfast.   Yes [provider]  Emollient (CETAPHIL) cream Apply 1 Application topically 2 (two) times daily. Apply to both legs   Yes [provider]  furosemide (LASIX) 40 MG tablet Take 40 mg by mouth daily.   Yes [provider]  levETIRAcetam (KEPPRA) 500 MG tablet Take 500 mg by mouth 2 (two) times daily.   Yes [provider]  levothyroxine  (SYNTHROID) 75 MCG tablet Take 75 mcg by mouth daily before breakfast.   Yes [provider]  losartan (COZAAR) 25 MG tablet Take 25 mg by mouth daily.   Yes [provider]  Magnesium Hydroxide (MILK OF MAGNESIA PO) Take 30 mLs by mouth as needed (if no BM in 3 days).   Yes [provider]  magnesium oxide (MAG-OX) 400 (240 Mg) MG tablet Take 400 mg by mouth 2 (two) times daily.   Yes [provider]  Nutritional Supplements (NUTRITIONAL DRINK) LIQD Take 120 mLs by mouth in the morning and at bedtime.   Yes [provider]  OXYGEN Inhale into the lungs continuous. 2 L   Yes [provider]  Pollen Extracts (PROSTAT PO) Take 30 mLs by mouth daily.   Yes [provider]  sodium chloride 1 g tablet Take 1 g by mouth 3 (three) times daily.   Yes [provider]  Sodium Phosphates (RA SALINE ENEMA RE) Place 1 Dose rectally as needed (if constipation is not relieved by suppository).   Yes [provider]  amLODipine (NORVASC) 5 MG tablet Take 2.5 mg by mouth at bedtime. Patient not taking: Reported on 08/12/2022    [provider]  amoxicillin-clavulanate (AUGMENTIN) 875-125 MG tablet Take 1 tablet by mouth 2 (two) times daily. Patient not taking: Reported on 08/04/2022    [provider]  carvedilol (COREG) 25 MG tablet Take 12.5 mg by mouth daily.  Patient not taking: Reported on 08/09/2022    [provider]  traMADol (ULTRAM) 50 MG tablet Take 1 tablet (50 mg total) by mouth every 6 (six) hours as needed for severe pain. Patient not taking: Reported on 08/06/2022 06/26/16   Palumbo, April, MD   Allergies  Allergen Reactions   Iodine Rash   Nifedipine Swelling   Thiazide-Type Diuretics Other (See Comments)    Hypoatremia (severe)   Escitalopram Other (See Comments)    Delusions   Neomycin-Polymyxin-Hc Other (See Comments)    Local reaction   Chlorthalidone Other (See Comments)    Reaction  not listed on MAR   Hydrochlorothiazide Other (See Comments)    Dropped Potassium    Hydrocodone Nausea And Vomiting   Review of Systems  Unable to perform ROS: Patient nonverbal    Physical Exam Vitals and nursing note reviewed.  Constitutional:      General: She is not in acute distress.    Appearance: She is cachectic. She is ill-appearing.  Pulmonary:     Effort: No respiratory distress.  Skin:    General: Skin is warm and dry.  Neurological:     Mental Status: She is alert.     Motor: Weakness present.  Psychiatric:        Speech: She is noncommunicative.     Vital Signs: BP (!) 118/46   Pulse 64   Temp (!) 96 F (35.6 C) (Axillary)   Resp (!) 26   Ht 4' 10"  (1.473 m)   Wt 45.4 kg   SpO2 97%   BMI 20.90 kg/m  Pain Scale: PAINAD   Pain Score: 0-No pain   SpO2: SpO2: 97 % O2 Device:SpO2: 97 % O2 Flow Rate: .O2 Flow Rate (L/min): 2 L/min  IO: Intake/output summary:  Intake/Output Summary (Last 24 hours) at 07/22/2022 1705 Last data filed at 08/06/2022 1622 Gross per 24 hour  Intake 1102.56 ml  Output --  Net 1102.56 ml    LBM:   Baseline Weight: Weight: 45.4 kg Most recent weight: Weight: 45.4 kg     Palliative Assessment/Data: PPS 10%     Time In: 1500 Time Out: 1630 Time Total: 90 minutes  Greater than 50%  of this time was spent counseling and coordinating care related to the above assessment and plan.  Signed by: Lin Landsman, NP   Please contact Palliative Medicine Team phone at [phone] for questions and concerns.  For individual provider: See Amion  *Portions of this note are a verbal dictation therefore any spelling and/or grammatical errors are due to the "[employer]" system interpretation.

## 2022-08-13 NOTE — ED Notes (Signed)
Per MRI, pt is unable to be scanned because family is not with the patient at this time.

## 2022-08-13 NOTE — ED Notes (Signed)
[place] MD notified of potassium 6.6

## 2022-08-13 NOTE — ED Notes (Signed)
Patient presents to ED with indwelling foley from facility.

## 2022-08-13 NOTE — ED Notes (Signed)
Pt returned to department.

## 2022-08-13 NOTE — ED Provider Notes (Signed)
[facility] EMERGENCY DEPARTMENT Provider Note   CSN: [id number] Arrival date & time: 08/01/2022  6301     History {Add pertinent medical, surgical, social history, OB history to HPI:1} Chief Complaint  Patient presents with  . Altered Mental Status    Sheri Hoffman is a 88 y.o. female.   Altered Mental Status Patient brought in from nursing home.  Mental status change.  Recent admission to nursing home because of stroke.  Decreased verbalization baseline.  Is had improving mental status somewhat reportedly slow to answer baseline.  Now nonverbal.  Minimally responsive at this time.  Patient is a DNR and appears to be in palliative care but not as to hospice.  Patient cannot really provide any history.    Past Medical History:  Diagnosis Date  . Cancer ([place])    skin cancer  . Hypertension   . Seizures ([place])    possible, pt is on medication to prevent seizures  . Stroke ([place])   . Thyroid disease     Home Medications Prior to Admission medications   Medication Sig Start Date End Date Taking? Authorizing Provider  aspirin 81 MG chewable tablet Chew 81 mg by mouth daily.   Yes [provider]  atorvastatin (LIPITOR) 40 MG tablet Take 40 mg by mouth daily.   Yes [provider]  bisacodyl (DULCOLAX) 10 MG suppository Place 10 mg rectally as needed for moderate constipation (if constipation is not relieved by MOM).   Yes [provider]  carvedilol (COREG) 6.25 MG tablet Take 6.25 mg by mouth 2 (two) times daily with a meal.   Yes [provider]  Cholecalciferol (VITAMIN D3) 50 MCG (2000 UT) capsule Take 2,000 Units by mouth daily.   Yes [provider]  doxazosin (CARDURA XL) 4 MG 24 hr tablet Take 4 mg by mouth daily with breakfast.   Yes [provider]  Emollient (CETAPHIL) cream Apply 1 Application topically 2 (two) times daily. Apply to both legs   Yes [provider]  furosemide (LASIX) 40 MG  tablet Take 40 mg by mouth daily.   Yes [provider]  levETIRAcetam (KEPPRA) 500 MG tablet Take 500 mg by mouth 2 (two) times daily.   Yes [provider]  levothyroxine (SYNTHROID) 75 MCG tablet Take 75 mcg by mouth daily before breakfast.   Yes [provider]  losartan (COZAAR) 25 MG tablet Take 25 mg by mouth daily.   Yes [provider]  Magnesium Hydroxide (MILK OF MAGNESIA PO) Take 30 mLs by mouth as needed (if no BM in 3 days).   Yes [provider]  magnesium oxide (MAG-OX) 400 (240 Mg) MG tablet Take 400 mg by mouth 2 (two) times daily.   Yes [provider]  Nutritional Supplements (NUTRITIONAL DRINK) LIQD Take 120 mLs by mouth in the morning and at bedtime.   Yes [provider]  OXYGEN Inhale into the lungs continuous. 2 L   Yes [provider]  Pollen Extracts (PROSTAT PO) Take 30 mLs by mouth daily.   Yes [provider]  sodium chloride 1 g tablet Take 1 g by mouth 3 (three) times daily.   Yes [provider]  Sodium Phosphates (RA SALINE ENEMA RE) Place 1 Dose rectally as needed (if constipation is not relieved by suppository).   Yes [provider]  amLODipine (NORVASC) 5 MG tablet Take 2.5 mg by mouth at bedtime. Patient not taking: Reported on 07/30/2022  [provider]  amoxicillin-clavulanate (AUGMENTIN) 875-125 MG tablet Take 1 tablet by mouth 2 (two) times daily. Patient not taking: Reported on 08/12/2022    [provider]  carvedilol (COREG) 25 MG tablet Take 12.5 mg by mouth daily.  Patient not taking: Reported on 08/11/2022    [provider]  furosemide (LASIX) 20 MG tablet Take 20 mg by mouth daily. Patient not taking: Reported on 08/02/2022    [provider]  levothyroxine (SYNTHROID, LEVOTHROID) 88 MCG tablet Take 88 mcg by mouth daily. Patient not taking: Reported on 07/22/2022    [provider]  traMADol (ULTRAM) 50  MG tablet Take 1 tablet (50 mg total) by mouth every 6 (six) hours as needed for severe pain. Patient not taking: Reported on 08/16/2022 06/26/16   Palumbo, April, MD      Allergies    Iodine, Nifedipine, Thiazide-type diuretics, Escitalopram, Neomycin-polymyxin-hc, Chlorthalidone, Hydrochlorothiazide, and Hydrocodone    Review of Systems   Review of Systems  Physical Exam Updated Vital Signs BP (!) 126/53   Pulse (!) 59   Temp (!) 96.7 F (35.9 C) (Rectal)   Resp 20   Ht '4\' 10"'$  (1.473 m)   Wt 45.4 kg   SpO2 99%   BMI 20.90 kg/m  Physical Exam Vitals reviewed.  HENT:     Mouth/Throat:     Mouth: Mucous membranes are dry.     Comments: Patient has a weak gag reflex Eyes:     Comments: Pupils mildly constricted.  Does have threat reflex.  Abdominal:     Tenderness: There is no abdominal tenderness.  Musculoskeletal:     Cervical back: Neck supple.     Comments: Pressure wound to both heels with bruising to right lower leg.  Also rather superficial buttock wound.  Skin:    Capillary Refill: Capillary refill takes less than 2 seconds.  Neurological:     Comments: Minimally responsive.  Reportedly will look to family member.  Will not follow commands for me.  Nonverbal.  Weak gag reflex    ED Results / Procedures / Treatments   Labs (all labs ordered are listed, but only abnormal results are displayed) Labs Reviewed  COMPREHENSIVE METABOLIC PANEL - Abnormal; Notable for the following components:      Result Value   Sodium 133 (*)    Potassium 6.6 (*)    Chloride 89 (*)    CO2 41 (*)    Glucose, Bld 100 (*)    BUN 26 (*)    Calcium 8.4 (*)    Total Protein 5.3 (*)    Albumin 2.4 (*)    Total Bilirubin 0.1 (*)    Anion gap 3 (*)    All other components within normal limits  CBC WITH DIFFERENTIAL/PLATELET - Abnormal; Notable for the following components:   RBC 3.66 (*)    Hemoglobin 11.3 (*)    Neutro Abs 8.0 (*)    Abs Immature Granulocytes 0.11 (*)    All other  components within normal limits  CULTURE, BLOOD (ROUTINE X 2)  CULTURE, BLOOD (ROUTINE X 2)  LACTIC ACID, PLASMA  URINALYSIS, ROUTINE W REFLEX MICROSCOPIC  BASIC METABOLIC PANEL  CK  TROPONIN I (HIGH SENSITIVITY)    EKG EKG Interpretation  Date/Time:  Monday August 13 2022 09:30:57 EDT Ventricular Rate:  67 PR Interval:  175 QRS Duration: 93 QT Interval:  347 QTC Calculation: 367 R Axis:   -29 Text Interpretation: Sinus rhythm Inferior infarct, old Lateral leads are  also involved Confirmed by Davonna Belling [phone]) on 08/07/2022 9:56:19 AM  Radiology CT HEAD WO CONTRAST (5MM)  Result Date: 08/06/2022 CLINICAL DATA:  Altered mental status EXAM: CT HEAD WITHOUT CONTRAST TECHNIQUE: Contiguous axial images were obtained from the base of the skull through the vertex without intravenous contrast. RADIATION DOSE REDUCTION: This exam was performed according to the departmental dose-optimization program which includes automated exposure control, adjustment of the mA and/or kV according to patient size and/or use of iterative reconstruction technique. COMPARISON:  CT brain, MR brain 07/26/2022 FINDINGS: Brain: No evidence of acute infarction, hemorrhage, hydrocephalus, extra-axial collection or mass lesion/mass effect. Extensive periventricular and deep white matter hypodensity. Similar nonacute lacunar infarctions of the thalami and left corona radiata. Vascular: No hyperdense vessel or unexpected calcification. Skull: Normal. Negative for fracture or focal lesion. Sinuses/Orbits: No acute finding. Other: None. IMPRESSION: No acute intracranial pathology. Advanced small-vessel white matter disease and nonacute lacunar infarctions of the thalami and left corona radiata, CT appearance not significantly changed compared to prior examination dated 07/26/2022. Consider repeat MR to further evaluate for new acute diffusion restricting infarction clinically suspected, given multifocal infarction  identified by prior MR but not clearly apparent by CT. Electronically Signed   By: Delanna Ahmadi M.D.   On: 08/07/2022 11:16   DG Chest Portable 1 View  Result Date: 07/19/2022 CLINICAL DATA:  Altered mental status EXAM: PORTABLE CHEST 1 VIEW COMPARISON:  Chest x-ray dated July 30, 2022 FINDINGS: Cardiac and mediastinal contours are unchanged. Biapical pleural-parenchymal scarring. Small bilateral pleural effusions and bibasilar atelectasis. No evidence of pneumothorax. IMPRESSION: Small bilateral pleural effusions and bibasilar atelectasis. Electronically Signed   By: Yetta Glassman M.D.   On: 08/11/2022 09:55    Procedures Procedures  {Document cardiac monitor, telemetry assessment procedure when appropriate:1}  Medications Ordered in ED Medications  lactated ringers bolus 1,000 mL (has no administration in time range)  sodium chloride 0.9 % bolus 500 mL (0 mLs Intravenous Stopped 07/23/2022 1234)    ED Course/ Medical Decision Making/ A&P                           Medical Decision Making Amount and/or Complexity of Data Reviewed Labs: ordered. Radiology: ordered.   Patient with mental status changes/encephalopathy.  Reportedly verbal at baseline.  Nonverbal now.  Minimal response to pain.  Discussed with patient's family members.  Son and daughter-in-law.  Patient would not want extraordinary measures.  Would not want intubation.  Is a DNR.  Patient's comfort is #1 priority at this time.  Creatinine is normal but potassium is potentially elevated.  No hemolysis mentioned but will recheck.  EKG reassuring.  Urinalysis still pending but is chronic Foley catheter.  Does have some chronic wounds on the legs.  First fluid bolus given without much change in mental status.  Will add another.  Will require admission to the hospital under unassigned medicine.  I think likely would benefit from palliative medicine for goals of care consult also.  Questionable seizure history but doubt seizure at  this time.  Head CT is stable from prior.  {Document critical care time when appropriate:1} {Document review of labs and clinical decision tools ie heart score, Chads2Vasc2 etc:1}  {Document your independent review of radiology images, and any outside records:1} {Document your discussion with family members, caretakers, and with consultants:1} {Document social determinants of health affecting pt's care:1} {Document your decision making why or why not admission, treatments were needed:1} Final  Clinical Impression(s) / ED Diagnoses Final diagnoses:  Dehydration  Encephalopathy    Rx / DC Orders ED Discharge Orders     None

## 2022-08-13 NOTE — Progress Notes (Addendum)
EEG did not show any significant seizure-like activity.  Patient was noted to have elevated PCO2 of 88.9 on venous blood gas.  Therefore likely cause of patient being acutely altered is hypercapnia at this time.  Orders have been placed to start patient on BiPAP and bed ordered changed to progressive.  Family updated at bedside.

## 2022-08-14 DIAGNOSIS — R0602 Shortness of breath: Secondary | ICD-10-CM

## 2022-08-14 DIAGNOSIS — L8990 Pressure ulcer of unspecified site, unspecified stage: Secondary | ICD-10-CM | POA: Insufficient documentation

## 2022-08-14 LAB — BASIC METABOLIC PANEL
Anion gap: 7 (ref 5–15)
BUN: 28 mg/dL — ABNORMAL HIGH (ref 8–23)
CO2: 37 mmol/L — ABNORMAL HIGH (ref 22–32)
Calcium: 8.2 mg/dL — ABNORMAL LOW (ref 8.9–10.3)
Chloride: 93 mmol/L — ABNORMAL LOW (ref 98–111)
Creatinine, Ser: 0.73 mg/dL (ref 0.44–1.00)
GFR, Estimated: >60 mL/min (ref >60)
Glucose, Bld: 84 mg/dL (ref 70–99)
Potassium: 6.1 mmol/L — ABNORMAL HIGH (ref 3.5–5.1)
Sodium: 137 mmol/L (ref 135–145)

## 2022-08-14 LAB — CBC
HCT: 36.3 % (ref 36.0–46.0)
Hemoglobin: 11.4 g/dL — ABNORMAL LOW (ref 12.0–15.0)
MCH: 30.9 pg (ref 26.0–34.0)
MCHC: 31.4 g/dL (ref 30.0–36.0)
MCV: 98.4 fL (ref 80.0–100.0)
Platelets: 194 10*3/uL (ref 150–400)
RBC: 3.69 MIL/uL — ABNORMAL LOW (ref 3.87–5.11)
RDW: 14.3 % (ref 11.5–15.5)
WBC: 9.2 10*3/uL (ref 4.0–10.5)
nRBC: 0 % (ref 0.0–0.2)

## 2022-08-14 LAB — BLOOD GAS, ARTERIAL
Acid-Base Excess: 11.1 mmol/L — ABNORMAL HIGH (ref 0.0–2.0)
Bicarbonate: 40.7 mmol/L — ABNORMAL HIGH (ref 20.0–28.0)
Drawn by: 164
O2 Saturation: 99 %
Patient temperature: 36.3
pCO2 arterial: 77 mmHg (ref 32–48)
pH, Arterial: 7.33 — ABNORMAL LOW (ref 7.35–7.45)
pO2, Arterial: 86 mmHg (ref 83–108)

## 2022-08-14 MED ORDER — LORAZEPAM 1 MG PO TABS: 1.0000 mg | ORAL_TABLET | ORAL | Status: DC | PRN

## 2022-08-14 MED ORDER — HALOPERIDOL 0.5 MG PO TABS: 0.5000 mg | ORAL_TABLET | ORAL | Status: DC | PRN

## 2022-08-14 MED ORDER — LORAZEPAM 2 MG/ML IJ SOLN: 1.0000 mg | INTRAMUSCULAR | Status: DC | PRN

## 2022-08-14 MED ORDER — HALOPERIDOL LACTATE 2 MG/ML PO CONC
2.0000 mg | Freq: Four times a day (QID) | ORAL | Status: DC | PRN
  Filled 2022-08-14: qty 5

## 2022-08-14 MED ORDER — ACETAMINOPHEN 325 MG PO TABS: 650.0000 mg | ORAL_TABLET | Freq: Four times a day (QID) | ORAL | Status: DC | PRN

## 2022-08-14 MED ORDER — LORAZEPAM 1 MG PO TABS
1.0000 mg | ORAL_TABLET | ORAL | Status: DC | PRN
Start: 2022-08-14 — End: 2022-08-15

## 2022-08-14 MED ORDER — GLYCOPYRROLATE 0.2 MG/ML IJ SOLN
0.2000 mg | INTRAMUSCULAR | Status: DC | PRN
Start: 2022-08-14 — End: 2022-08-15

## 2022-08-14 MED ORDER — SODIUM CHLORIDE 0.9% FLUSH
3.0000 mL | INTRAVENOUS | Status: DC | PRN
Start: 2022-08-14 — End: 2022-08-14

## 2022-08-14 MED ORDER — MORPHINE BOLUS VIA INFUSION: 1.0000 mg | INTRAVENOUS | Status: DC | PRN

## 2022-08-14 MED ORDER — LORAZEPAM 2 MG/ML PO CONC: 1.0000 mg | ORAL | Status: DC | PRN

## 2022-08-14 MED ORDER — SODIUM CHLORIDE 0.9% FLUSH: 3.0000 mL | Freq: Two times a day (BID) | INTRAVENOUS | Status: DC

## 2022-08-14 MED ORDER — MORPHINE 100MG IN NS 100ML (1MG/ML) PREMIX INFUSION: 2.0000 mg/h | INTRAVENOUS | Status: DC

## 2022-08-14 MED ORDER — ONDANSETRON 4 MG PO TBDP: 4.0000 mg | ORAL_TABLET | Freq: Four times a day (QID) | ORAL | Status: DC | PRN

## 2022-08-14 MED ORDER — HALOPERIDOL 1 MG PO TABS
2.0000 mg | ORAL_TABLET | Freq: Four times a day (QID) | ORAL | Status: DC | PRN
  Filled 2022-08-14: qty 2

## 2022-08-14 MED ORDER — SODIUM CHLORIDE 0.9 % IV SOLN: 250.0000 mL | INTRAVENOUS | Status: DC | PRN

## 2022-08-14 MED ORDER — MORPHINE 100MG IN NS 100ML (1MG/ML) PREMIX INFUSION
1.0000 mg/h | INTRAVENOUS | Status: DC
  Administered 2022-08-14: 1 mg/h via INTRAVENOUS
  Filled 2022-08-14: qty 100

## 2022-08-14 MED ORDER — ONDANSETRON HCL 4 MG/2ML IJ SOLN: 4.0000 mg | Freq: Four times a day (QID) | INTRAMUSCULAR | Status: DC | PRN

## 2022-08-14 MED ORDER — SODIUM POLYSTYRENE SULFONATE 15 GM/60ML PO SUSP
30.0000 g | Freq: Two times a day (BID) | ORAL | Status: DC
  Filled 2022-08-14: qty 120

## 2022-08-14 MED ORDER — LORAZEPAM 2 MG/ML IJ SOLN
1.0000 mg | INTRAMUSCULAR | Status: DC | PRN
Start: 2022-08-14 — End: 2022-08-15

## 2022-08-14 MED ORDER — MORPHINE BOLUS VIA INFUSION: 2.0000 mg | INTRAVENOUS | Status: DC | PRN

## 2022-08-14 MED ORDER — HALOPERIDOL LACTATE 2 MG/ML PO CONC: 0.5000 mg | ORAL | Status: DC | PRN

## 2022-08-14 MED ORDER — HALOPERIDOL LACTATE 5 MG/ML IJ SOLN: 2.0000 mg | INTRAMUSCULAR | Status: DC | PRN

## 2022-08-14 MED ORDER — BIOTENE DRY MOUTH MT LIQD
15.0000 mL | Freq: Two times a day (BID) | OROMUCOSAL | Status: DC
  Administered 2022-08-14 (×2): 15 mL via OROMUCOSAL

## 2022-08-14 MED ORDER — HALOPERIDOL LACTATE 5 MG/ML IJ SOLN
0.5000 mg | INTRAMUSCULAR | Status: DC | PRN
Start: 2022-08-14 — End: 2022-08-14

## 2022-08-14 NOTE — Progress Notes (Signed)
[facility] PROGRESS NOTE    Progress Note  Sheri Hoffman  DJM:[id number] DOB: 11-27-1928 DOA: 08/05/2022 PCP: Merryl Hacker, No     Brief Narrative:   Sheri Hoffman is an 88 y.o. female past medical history significant for essential hypertension, CVA seizure hypothyroidism comes in for change in breathing and altered mental status, most of the history is obtained from daughter-in-law as the patient cannot provide history.  Recently discharged from [facility] on 08/02/2022 after a fall found to have a multifocal acute nonhemorrhagic CVA at the nursing home facility family was not able to communicate with the patient, in the ED, CT of the head showed no acute findings she was fluid resuscitated.    Assessment/Plan:   Goals of care/ethics: I had a long discussion with son and daughter and expressed the poor prognosis she had. They relate that their mom has been deteriorating over the last year, and they have discussed is among the results and they would like to move towards comfort measures. They would like all medications are not related to comfort stop.  And only use medications that would make her mom comfortable. They would also like to stop all lab draws or imaging. We will go ahead and start her on a morphine drip. We will get TOC to try to get her to a residential hospice facility.  Acute metabolic encephalopathy CT scan of the head showed no acute abnormalities. The patient has a history of possible seizures and is currently on Keppra. She has had no events on telemetry. Discontinue Keppra moving towards comfort measures.  Acute respiratory failure with hypoxia and hypercapnia ([place]): Likely due to pulmonary edema. Currently on BiPAP, sating > 90%. The family would like to keep her on BiPAP until they get here then we can switch her to nasal cannula they want all of the treatment stopped.  Hyperkalemia: EKG without significant changes. She was started on IV fluids go  ahead and give her Kayexalate enema.  History of CVA with residual deficit: None.  Hypovolemic hyponatremia: She is also hypochloremic with an elevated potassium. KVO IV fluids.  History of seizure disorder: Disc continue IV Keppra  Hypothyroidism: With a TSH of 4.7 continue Synthroid.  Hypoalbuminemia: 2.4  Stage II significant was also present on admission: RN Pressure Injury Documentation: Pressure Injury 07/19/2022 Buttocks Stage 2 -  Partial thickness loss of dermis presenting as a shallow open injury with a red, pink wound bed without slough. healing stage 2 (Active)  07/26/2022   Location: Buttocks  Location Orientation:   Staging: Stage 2 -  Partial thickness loss of dermis presenting as a shallow open injury with a red, pink wound bed without slough.  Wound Description (Comments): healing stage 2  Present on Admission: Yes    Estimated body mass index is 20.9 kg/m as calculated from the following:   Height as of this encounter: '4\' 10"'$  (1.473 m).   Weight as of this encounter: 45.4 kg.  DVT prophylaxis: lovenox Family Communication:none Status is: Inpatient Remains inpatient appropriate because: Moving towards comfort measures.    Code Status:     Code Status Orders  (From admission, onward)           Start     Ordered   08/05/2022 1350  Do not attempt resuscitation (DNR)  Continuous       Question Answer Comment  In the event of cardiac or respiratory ARREST Do not call a "code blue"   In the event of  cardiac or respiratory ARREST Do not perform Intubation, CPR, defibrillation or ACLS   In the event of cardiac or respiratory ARREST Use medication by any route, position, wound care, and other measures to relive pain and suffering. May use oxygen, suction and manual treatment of airway obstruction as needed for comfort.      08/16/2022 1349           Code Status History     Date Active Date Inactive Code Status Order ID Comments User Context    08/10/2022 1345 07/26/2022 1349 Full Code [id number]  Norval Morton, MD ED      Advance Directive Documentation    [place] Most Recent Value  Type of Advance Directive Out of facility DNR (pink MOST or yellow form)  Pre-existing out of facility DNR order (yellow form or pink MOST form) Pink MOST/Yellow Form most recent copy in chart - Physician notified to receive inpatient order  "MOST" Form in Place? --         IV Access:   Peripheral IV   Procedures and diagnostic studies:   EEG adult  Result Date: 07/31/2022 Lora Havens, MD     07/21/2022  5:52 PM Patient Name: Sheri Hoffman MRN: [id number] Epilepsy Attending: Lora Havens Referring Physician/Provider: Norval Morton, MD Date: 08/10/2022 Duration: 22.27 mins Patient history: 88yo F with h/o seizure now with ams. EEG to evaluate to seizure. Level of alertness: Awake AEDs during EEG study: LEV Technical aspects: This EEG study was done with scalp electrodes positioned according to the 10-20 International system of electrode placement. Electrical activity was reviewed with band pass filter of 1-'70Hz'$ , sensitivity of 7 uV/mm, display speed of 22m/sec with a '60Hz'$  notched filter applied as appropriate. EEG data were recorded continuously and digitally stored.  Video monitoring was available and reviewed as appropriate. Description: No posterior dominant rhythm was seen. EEG showed continuous generalized polymorphic sharply contoured 3 to 5 Hz theta-delta slowing. Hyperventilation and photic stimulation were not performed.   ABNORMALITY - Continuous slow, generalized IMPRESSION: This study is suggestive of moderate diffuse encephalopathy, nonspecific etiology. No seizures or epileptiform discharges were seen throughout the recording. PTurner  CT HEAD WO CONTRAST (5MM)  Result Date: 07/23/2022 CLINICAL DATA:  Altered mental status EXAM: CT HEAD WITHOUT CONTRAST TECHNIQUE: Contiguous axial images were obtained from  the base of the skull through the vertex without intravenous contrast. RADIATION DOSE REDUCTION: This exam was performed according to the departmental dose-optimization program which includes automated exposure control, adjustment of the mA and/or kV according to patient size and/or use of iterative reconstruction technique. COMPARISON:  CT brain, MR brain 07/26/2022 FINDINGS: Brain: No evidence of acute infarction, hemorrhage, hydrocephalus, extra-axial collection or mass lesion/mass effect. Extensive periventricular and deep white matter hypodensity. Similar nonacute lacunar infarctions of the thalami and left corona radiata. Vascular: No hyperdense vessel or unexpected calcification. Skull: Normal. Negative for fracture or focal lesion. Sinuses/Orbits: No acute finding. Other: None. IMPRESSION: No acute intracranial pathology. Advanced small-vessel white matter disease and nonacute lacunar infarctions of the thalami and left corona radiata, CT appearance not significantly changed compared to prior examination dated 07/26/2022. Consider repeat MR to further evaluate for new acute diffusion restricting infarction clinically suspected, given multifocal infarction identified by prior MR but not clearly apparent by CT. Electronically Signed   By: ADelanna AhmadiM.D.   On: 07/27/2022 11:16   DG Chest Portable 1 View  Result Date: 08/04/2022 CLINICAL DATA:  Altered mental  status EXAM: PORTABLE CHEST 1 VIEW COMPARISON:  Chest x-ray dated July 30, 2022 FINDINGS: Cardiac and mediastinal contours are unchanged. Biapical pleural-parenchymal scarring. Small bilateral pleural effusions and bibasilar atelectasis. No evidence of pneumothorax. IMPRESSION: Small bilateral pleural effusions and bibasilar atelectasis. Electronically Signed   By: Yetta Glassman M.D.   On: 07/31/2022 09:55     Medical Consultants:   None.   Subjective:    Sheri Hoffman nonverbal this morning.  Objective:    Vitals:   07/18/2022  2134 07/17/2022 2335 07/25/2022 2340 08/14/22 0400  BP:  (!) 130/54  (!) 129/55  Pulse:  62 60 67  Resp:  '16 18 15  '$ Temp: 97.9 F (36.6 C) (!) 97.4 F (36.3 C)  98.1 F (36.7 C)  TempSrc: Oral Axillary  Oral  SpO2:  100% 100% 99%  Weight:      Height:       SpO2: 99 % O2 Flow Rate (L/min): 2 L/min   Intake/Output Summary (Last 24 hours) at 08/14/2022 0717 Last data filed at 08/14/2022 0600 Gross per 24 hour  Intake 1102.56 ml  Output 300 ml  Net 802.56 ml   Filed Weights   08/06/2022 0929  Weight: 45.4 kg    Exam: General exam: In no acute distress. Respiratory system: Good air movement and clear to auscultation. Cardiovascular system: S1 & S2 heard, RRR. No JVD. Gastrointestinal system: Abdomen is nondistended, soft and nontender.  Extremities: No pedal edema. Skin: No rashes, lesions or ulcers  Data Reviewed:    Labs: Basic Metabolic Panel: Recent Labs  Lab 07/25/2022 0940 08/11/2022 1533 07/27/2022 1540 08/14/22 0249  NA 133* 134* 131* 137  K 6.6* 6.6* 6.1* 6.1*  CL 89* 95*  --  93*  CO2 41* 34*  --  37*  GLUCOSE 100* 95  --  84  BUN 26* 25*  --  28*  CREATININE 0.76 0.75  --  0.73  CALCIUM 8.4* 8.0*  --  8.2*   GFR Estimated Creatinine Clearance: 29.6 mL/min (by C-G formula based on SCr of 0.73 mg/dL). Liver Function Tests: Recent Labs  Lab 08/05/2022 0940 07/25/2022 1533  AST 26 28  ALT 28 30  ALKPHOS 67 65  BILITOT 0.1* 0.5  PROT 5.3* 5.3*  ALBUMIN 2.4* 2.3*   No results for input(s): "LIPASE", "AMYLASE" in the last 168 hours. No results for input(s): "AMMONIA" in the last 168 hours. Coagulation profile No results for input(s): "INR", "PROTIME" in the last 168 hours. COVID-19 Labs  No results for input(s): "DDIMER", "FERRITIN", "LDH", "CRP" in the last 72 hours.  No results found for: "SARSCOV2NAA"  CBC: Recent Labs  Lab 07/19/2022 0940 07/21/2022 1540 08/14/22 0249  WBC 9.2  --  9.2  NEUTROABS 8.0*  --   --   HGB 11.3* 12.2 11.4*  HCT 36.6  36.0 36.3  MCV 100.0  --  98.4  PLT 234  --  194   Cardiac Enzymes: Recent Labs  Lab 08/10/2022 1533  CKTOTAL 74   BNP (last 3 results) No results for input(s): "PROBNP" in the last 8760 hours. CBG: No results for input(s): "GLUCAP" in the last 168 hours. D-Dimer: No results for input(s): "DDIMER" in the last 72 hours. Hgb A1c: No results for input(s): "HGBA1C" in the last 72 hours. Lipid Profile: No results for input(s): "CHOL", "HDL", "LDLCALC", "TRIG", "CHOLHDL", "LDLDIRECT" in the last 72 hours. Thyroid function studies: No results for input(s): "TSH", "T4TOTAL", "T3FREE", "THYROIDAB" in the last 72 hours.  Invalid  input(s): "FREET3" Anemia work up: No results for input(s): "VITAMINB12", "FOLATE", "FERRITIN", "TIBC", "IRON", "RETICCTPCT" in the last 72 hours. Sepsis Labs: Recent Labs  Lab 07/31/2022 0940 08/14/22 0249  WBC 9.2 9.2  LATICACIDVEN 0.7  --    Microbiology No results found for this or any previous visit (from the past 240 hour(s)).   Medications:    enoxaparin (LOVENOX) injection  30 mg Subcutaneous Q24H   sodium chloride flush  3 mL Intravenous Q12H   Continuous Infusions:  sodium chloride 75 mL/hr at 08/14/22 0641   levETIRAcetam Stopped (08/05/2022 2134)      LOS: 1 day   Charlynne Cousins  [facility]  08/14/2022, 7:17 AM

## 2022-08-14 NOTE — Progress Notes (Signed)
HOSPITAL MEDICINE OVERNIGHT EVENT NOTE    Notified by nursing that patient's VBG performed at 11:26 PM revealed a persisting respiratory acidosis with pH of 7.28 and PCO2 of 86.  This is on BiPAP therapy.  Patient apparently was placed on BiPAP by the day provider due to concerns for acute hypercapnic respiratory failure complicated by hypercapnic encephalopathy.  This recent VBG is minimally improved from the initial VBG obtained earlier in the afternoon.  Case discussed with respiratory therapy.  It turns out that respiratory therapy just adjusted the patient's BiPAP settings at approximately the same time that the VBG was obtained.  Patient is now on 20/5 on BiPAP therapy.  We will obtain a repeat ABG at 1:30 AM, review with respiratory and decide if whether further BiPAP therapy adjustments need to be made.  Continuing to monitor patient closely.  Vernelle Emerald  MD [facility]   ADDENDUM 2:03am 8/29  ABG performed at 1:30 AM revealing a slow but steady uptrend in pH with pH of 7.33, PCO2 of 77 and PaO2 of 86.  This slow upward trend is reassuring and there for BiPAP therapy with the current settings should be continued.  Patient will continue to require very close monitoring.  Sherryll Burger Jelicia Nantz

## 2022-08-14 NOTE — Progress Notes (Addendum)
Pt taken off bipap and placed on 2L [place] at this time. Pt With no increased WOB, RR has come down from 28 to 20, HR decreased as well off bipap, pt appears more comfortable on [place]. Pt denies SOB. RT will continue to monitor closely. VS WNL. RN made aware that pt appears more comfortable off bipap and RT will place pt back on bipap at any signs of distress or increased WOB. Pt alert at this time. RN to bedside and will call RT if pt needs to go back on bipap.

## 2022-08-14 NOTE — Progress Notes (Signed)
Daily Progress Note   Patient Name: Sheri Hoffman       Date: 08/14/2022 DOB: 06-15-1928  Age: 88 y.o. MRN#: [id number] Attending Physician: Charlynne Cousins, MD Primary Care Physician: Pcp, No Admit Date: 07/28/2022  Reason for Consultation/Follow-up: Non pain symptom management, Pain control, Psychosocial/spiritual support, and Terminal Care  Subjective: Chart review performed. Received report from primary RN - no acute concerns. Morphine drip started, bipap off.   Went to visit patient at bedside - son/[name], DIL/[name], daughter/Karen present. Patient was lying in bed awake, alert, oriented to self only, and able to participate in minimal conversation. No signs or non-verbal gestures of pain or discomfort noted. No respiratory distress, increased work of breathing, or secretions noted. She denies pain, endorses shortness of breath "sometimes," and repeats that she "doesn't feel too good today." She  is on 2L O2 [place].  Emotional support provided to family. Family confirm goal is for full comfort measures today. Discussed hospice facility transfer - they [facility].  Symptom management plan reviewed with family - they expressed understanding.  All questions and concerns addressed. Encouraged to call with questions and/or concerns. PMT card provided.  Length of Stay: 1  Current Medications: Scheduled Meds:   sodium chloride flush  3 mL Intravenous Q12H    Continuous Infusions:  sodium chloride     morphine 1 mg/hr (08/14/22 0939)    PRN Meds: sodium chloride, haloperidol **OR** haloperidol **OR** haloperidol lactate, LORazepam **OR** [DISCONTINUED] LORazepam **OR** LORazepam, morphine, ondansetron **OR** ondansetron (ZOFRAN) IV, sodium  chloride flush  Physical Exam Vitals and nursing note reviewed.  Constitutional:      General: She is not in acute distress.    Appearance: She is cachectic. She is ill-appearing.  Pulmonary:     Effort: No respiratory distress.  Skin:    General: Skin is warm and dry.  Neurological:     Mental Status: She is alert. She is disoriented and confused.     Motor: Weakness present.  Psychiatric:        Behavior: Behavior is cooperative.        Cognition and Memory: Cognition is impaired. Memory is impaired.             Vital Signs: BP (!) 129/55   Pulse 67   Temp  98.1 F (36.7 C) (Oral)   Resp 15   Ht '4\' 10"'$  (1.473 m)   Wt 45.4 kg   SpO2 99%   BMI 20.90 kg/m  SpO2: SpO2: 99 % O2 Device: O2 Device: Bi-PAP O2 Flow Rate: O2 Flow Rate (L/min): 2 L/min  Intake/output summary:  Intake/Output Summary (Last 24 hours) at 08/14/2022 1110 Last data filed at 08/14/2022 0600 Gross per 24 hour  Intake 1102.56 ml  Output 300 ml  Net 802.56 ml   LBM:   Baseline Weight: Weight: 45.4 kg Most recent weight: Weight: 45.4 kg       Palliative Assessment/Data: PPS 10%      Patient Active Problem List   Diagnosis Date Noted   Pressure injury of skin 19/37/9024   Acute metabolic encephalopathy 09/73/5329   History of CVA with residual deficit 08/06/2022   Hyperkalemia 07/25/2022   Dehydration 08/06/2022   Hyponatremia 08/01/2022   History of seizure disorder 07/17/2022   Hypothyroidism 07/26/2022   Hypoalbuminemia 08/12/2022   Acute respiratory failure with hypoxia and hypercapnia (HCC) 07/28/2022    Palliative Care Assessment & Plan   Patient Profile: 88 y.o. female  with past medical history of  hypertension, CVA, seizure disorder, and hypothyroidism presented to ED on 08/11/2022 from [facility] with staff concerns of her change of breathing and altered mental status.  Patient was recently hospitalized at [facility] from 8/10 - 08/02/2022 after a fall found to  have had a stroke.  Patient was admitted admitted on 08/03/2022 with acute metabolic encephalopathy, hyperkalemia, history of CVA with residual deficit, dehydration, hypoalbuminemia, history of seizure disorder.   Assessment: Principal Problem:   Acute metabolic encephalopathy Active Problems:   History of CVA with residual deficit   Hyperkalemia   Dehydration   Hyponatremia   History of seizure disorder   Hypothyroidism   Hypoalbuminemia   Acute respiratory failure with hypoxia and hypercapnia (HCC)   Pressure injury of skin   Terminal care  Recommendations/Plan: Initiated full comfort measures Continue DNR/DNI as previously documented Transfer to [facility] location when bed available - TOC consult placed; TOC and hospice liaison notified Added orders for EOL symptom management and to reflect full comfort measures, as well as discontinued orders that were not focused on comfort Unrestricted visitation orders were placed per current [place] EOL visitation policy  Nursing to provide frequent assessments and administer PRN medications as clinically necessary to ensure EOL comfort PMT will continue to follow and support holistically  Symptom Management Continuous morphine infusion; bolus doses for breakthrough symptoms Tylenol PRN pain/fever Biotin twice daily Benadryl PRN itching Robinul PRN secretions Haldol PRN agitation/delirium Ativan PRN anxiety/seizure/sleep/distress Zofran PRN nausea/vomiting Liquifilm Tears PRN dry eye   Goals of Care and Additional Recommendations: Limitations on Scope of Treatment: Full Comfort Care  Code Status:    Code Status Orders  (From admission, onward)           Start     Ordered   08/14/22 0750  Do not attempt resuscitation (DNR)  Continuous       Question Answer Comment  In the event of cardiac or respiratory ARREST Do not call a "code blue"   In the event of cardiac or respiratory ARREST Do  not perform Intubation, CPR, defibrillation or ACLS   In the event of cardiac or respiratory ARREST Use medication by any route, position, wound care, and other measures to relive pain and suffering. May use oxygen, suction  and manual treatment of airway obstruction as needed for comfort.      08/14/22 0751           Code Status History     Date Active Date Inactive Code Status Order ID Comments User Context   07/18/2022 1349 08/14/2022 0751 DNR [id number]  Norval Morton, MD ED   07/17/2022 1345 08/09/2022 1349 Full Code [id number]  Norval Morton, MD ED      Advance Directive Documentation    [place] Most Recent Value  Type of Advance Directive Out of facility DNR (pink MOST or yellow form)  Pre-existing out of facility DNR order (yellow form or pink MOST form) Pink MOST/Yellow Form most recent copy in chart - Physician notified to receive inpatient order  "MOST" Form in Place? --       Prognosis:  < 2 weeks  Discharge Planning: Hospice facility  Care plan was discussed with primary RN, Dr. Aileen Fass, hospice liaison, [facility], patient's family  Thank you for allowing the Palliative Medicine Team to assist in the care of this patient.   Lin Landsman, NP  Please contact Palliative Medicine Team phone at [phone] for questions and concerns.   *Portions of this note are a verbal dictation therefore any spelling and/or grammatical errors are due to the "[place]" system interpretation.

## 2022-08-14 NOTE — TOC Initial Note (Addendum)
Transition of Care [facility]) - Initial/Assessment Note    Patient Details  Name: Sheri Hoffman MRN: [id number] Date of Birth: 11-29-1928  Transition of Care [facility]) CM/SW Contact:    Benard Halsted, LCSW Phone Number: 08/14/2022, 11:03 AM  Clinical Narrative:                 11am-CSW received consult for hospice facility placement. Son, [name], is point of contact for family and has preference for [facility]. CSW left message for [facility] liaison.   11:15am-[facility] will review referral however does not have beds available today.  Expected Discharge Plan: [place] Barriers to Discharge: Hospice Bed not available   Patient Goals and CMS Choice Patient states their goals for this hospitalization and ongoing recovery are:: Comfort CMS Medicare.gov Compare Post Acute Care list provided to:: Patient Represenative (must comment) Choice offered to / list presented to : Adult Children  Expected Discharge Plan and Services Expected Discharge Plan: [place] In-house Referral: Clinical Social Work   Post Acute Care Choice: Residential Hospice Bed Living arrangements for the past 2 months: [place]                                      Prior Living Arrangements/Services Living arrangements for the past 2 months: [place]   Patient language and need for interpreter reviewed:: Yes Do you feel safe going back to the place where you live?: Yes      Need for Family Participation in Patient Care: Yes (Comment)     Criminal Activity/Legal Involvement Pertinent to Current Situation/Hospitalization: No - Comment as needed  Activities of Daily Living      Permission Sought/Granted Permission sought to share information with : Facility Sport and exercise psychologist, Family Supports Permission granted to share information with : No  Share Information with NAME:  [name]  Permission granted to share info w AGENCY: Hospice  Permission granted to share info w Relationship: Son  Permission granted to share info w Contact Information: [phone]  Emotional Assessment Appearance:: Appears stated age Attitude/Demeanor/Rapport: Unable to Assess Affect (typically observed): Unable to Assess Orientation: : Oriented to Self Alcohol / Substance Use: Not Applicable Psych Involvement: No (comment)  Admission diagnosis:  Dehydration [E86.0] Encephalopathy [G93.40] Acute respiratory failure with hypoxia and hypercapnia (HCC) [J96.01, V37.10] Acute metabolic encephalopathy [G26.94] Patient Active Problem List   Diagnosis Date Noted   Pressure injury of skin 85/46/2703   Acute metabolic encephalopathy 50/08/3817   History of CVA with residual deficit 08/03/2022   Hyperkalemia 07/18/2022   Dehydration 08/12/2022   Hyponatremia 08/14/2022   History of seizure disorder 08/13/2022   Hypothyroidism 07/17/2022   Hypoalbuminemia 08/03/2022   Acute respiratory failure with hypoxia and hypercapnia ([place]) 08/12/2022   PCP:  Pcp, No Pharmacy:   [id number] - [place], [address] [address] Phone: [phone] Fax: [phone]     Social Determinants of Health (SDOH) Interventions    Readmission Risk Interventions     No data to display

## 2022-08-16 NOTE — Progress Notes (Signed)
This encounter was created in error - please disregard.

## 2022-08-17 NOTE — Progress Notes (Signed)
Daily Progress Note   Patient Name: Sheri Hoffman       Date: 09/04/22 DOB: [birth year]  Age: 88 y.o. MRN#: [id number] Attending Physician: Jonetta Osgood, MD Primary Care Physician: Pcp, No Admit Date: 07/29/2022  Reason for Consultation/Follow-up: Non pain symptom management, Pain control, Psychosocial/spiritual support, and Terminal Care  Subjective: Chart review performed.  Patient assessed at the bedside.She is unresponsive with agonal breathing, warm extremities.  No signs of pain or distress.  Her family is present at the bedside visiting.  Per family, agonal breathing pattern has been new since arrival earlier this morning.  They have not noticed any other concerns.  Education was provided.  Reviewed symptom management plan and family is agreeable to increasing morphine drip to 3 mg/h at this time.  All questions and concerns addressed. Encouraged to call with questions and/or concerns. PMT card previously provided.  Length of Stay: 2  Physical Exam Vitals and nursing note reviewed.  Constitutional:      General: She is not in acute distress.    Appearance: She is cachectic. She is ill-appearing.  Pulmonary:     Effort: Pulmonary effort is normal. No respiratory distress.     Comments: Agonal breathing pattern Skin:    General: Skin is warm and dry.  Neurological:     Mental Status: She is unresponsive.            Vital Signs: BP (!) 82/44 (BP Location: Left Arm)   Pulse 72   Temp 98.1 F (36.7 C)   Resp 13   Ht '4\' 10"'$  (1.473 m)   Wt 45.4 kg   SpO2 98%   BMI 20.90 kg/m  SpO2: SpO2: 98 % O2 Device: O2 Device: Nasal Cannula O2 Flow Rate: O2 Flow Rate (L/min): 3 L/min       Palliative Assessment/Data: PPS 10%    Palliative Care Assessment & Plan   Patient  Profile: 88 y.o. female  with past medical history of  hypertension, CVA, seizure disorder, and hypothyroidism presented to ED on 08/09/2022 from [facility] with staff concerns of her change of breathing and altered mental status.  Patient was recently hospitalized at [facility] from 8/10 - 08/02/2022 after a fall found to have had a stroke.  Patient was admitted admitted on 07/22/2022 with acute metabolic encephalopathy,  hyperkalemia, history of CVA with residual deficit, dehydration, hypoalbuminemia, history of seizure disorder.   Assessment: Principal Problem:   Acute metabolic encephalopathy Active Problems:   History of CVA with residual deficit   Hyperkalemia   Dehydration   Hyponatremia   History of seizure disorder   Hypothyroidism   Hypoalbuminemia   Acute respiratory failure with hypoxia and hypercapnia (HCC)   Pressure injury of skin   Terminal care  Recommendations/Plan: Continue DNR/DNI  Continue comfort care measures per [facility] was family preference however in-hospital death is possible, no beds available today and will re-assess tomorrow PMT will continue to follow and support holistically   Prognosis:  Hours - Days  Discharge Planning: Anticipated Hospital Death  Care plan was discussed with primary RN, patient's family   Total time: I spent 35 minutes in the care of the patient today in the above activities and documenting the encounter.   Dorthy Cooler, PA-C Palliative Medicine Team Team phone # [phone]  Thank you for allowing the Palliative Medicine Team to assist in the care of this patient. Please utilize secure chat with additional questions, if there is no response within 30 minutes please call the above phone number.  Palliative Medicine Team providers are available by phone from 7am to 7pm daily and can be reached through the team cell phone.  Should this patient require assistance  outside of these hours, please call the patient's attending physician.

## 2022-08-17 NOTE — Progress Notes (Addendum)
Patient expired at 1308. This nurse and Memory Argue, RN called TOD. Ghimire, MD notified. Agricultural consultant notified.   Family was present at TOD with patient.

## 2022-08-17 NOTE — Death Summary Note (Signed)
DEATH SUMMARY   Patient Details  Name: Sheri Hoffman MRN: [id number] DOB: 30-Mar-1928 PCP:Pcp, No Admission/Discharge Information   Admit Date:  09/06/22  Date of Death: Date of Death: 09-08-22  Time of Death: Time of Death: 50  Length of Stay: 2   Principle Cause of death: Acute metabolic encephalopathy.   Hospital Course: 88 year old with history of CVA, seizure disorder, HTN who presented with altered mental status-patient was found to have acute metabolic encephalopathy likely due to hypercarbia.  Patient was started on BiPAP however continued to have persistent hypercarbia-after extensive discussion with family by prior MD-patient was transitioned to full comfort measures.  While awaiting residential hospice placement-patient declined and expired at 1308 hrs on Sep 09, 2023.  Assessment and Plan: Acute hypercapnic respiratory failure with acute metabolic encephalopathy: No significant response to BiPAP.  Initially there was concern for status epilepticus however EEG was negative.  History of CVA: CT head was negative-MRI was not able to be completed due to clinical deterioration.  Not felt to have acute CVA.  Hyponatremia: Likely due to dehydration-this was managed with supportive care.  History of seizure disorder: Patient was continued on Keppra.  Hypothyroidism: Remain on levothyroxine-but this was discontinued once comfort measures were started.  HTN: All antihypertensives were held once comfort measures were started.   Procedures: None  Consultations: Palliative care  The results of significant diagnostics from this hospitalization (including imaging, microbiology, ancillary and laboratory) are listed below for reference.   Significant Diagnostic Studies: EEG adult  Result Date: 09-06-22 Lora Havens, MD     06-Sep-2022  5:52 PM Patient Name: Sheri Hoffman MRN: [id number] Epilepsy Attending: Lora Havens Referring Physician/Provider: Norval Morton, MD  Date: 09/06/2022 Duration: 22.27 mins Patient history: 88yo F with h/o seizure now with ams. EEG to evaluate to seizure. Level of alertness: Awake AEDs during EEG study: LEV Technical aspects: This EEG study was done with scalp electrodes positioned according to the 10-20 International system of electrode placement. Electrical activity was reviewed with band pass filter of 1-'70Hz'$ , sensitivity of 7 uV/mm, display speed of 89m/sec with a '60Hz'$  notched filter applied as appropriate. EEG data were recorded continuously and digitally stored.  Video monitoring was available and reviewed as appropriate. Description: No posterior dominant rhythm was seen. EEG showed continuous generalized polymorphic sharply contoured 3 to 5 Hz theta-delta slowing. Hyperventilation and photic stimulation were not performed.   ABNORMALITY - Continuous slow, generalized IMPRESSION: This study is suggestive of moderate diffuse encephalopathy, nonspecific etiology. No seizures or epileptiform discharges were seen throughout the recording. PBel-Nor  CT HEAD WO CONTRAST (5MM)  Result Date: 809/21/2023CLINICAL DATA:  Altered mental status EXAM: CT HEAD WITHOUT CONTRAST TECHNIQUE: Contiguous axial images were obtained from the base of the skull through the vertex without intravenous contrast. RADIATION DOSE REDUCTION: This exam was performed according to the departmental dose-optimization program which includes automated exposure control, adjustment of the mA and/or kV according to patient size and/or use of iterative reconstruction technique. COMPARISON:  CT brain, MR brain 07/26/2022 FINDINGS: Brain: No evidence of acute infarction, hemorrhage, hydrocephalus, extra-axial collection or mass lesion/mass effect. Extensive periventricular and deep white matter hypodensity. Similar nonacute lacunar infarctions of the thalami and left corona radiata. Vascular: No hyperdense vessel or unexpected calcification. Skull: Normal. Negative for  fracture or focal lesion. Sinuses/Orbits: No acute finding. Other: None. IMPRESSION: No acute intracranial pathology. Advanced small-vessel white matter disease and nonacute lacunar infarctions of the thalami and left corona radiata,  CT appearance not significantly changed compared to prior examination dated 07/26/2022. Consider repeat MR to further evaluate for new acute diffusion restricting infarction clinically suspected, given multifocal infarction identified by prior MR but not clearly apparent by CT. Electronically Signed   By: Delanna Ahmadi M.D.   On: 08/12/2022 11:16   DG Chest Portable 1 View  Result Date: 07/23/2022 CLINICAL DATA:  Altered mental status EXAM: PORTABLE CHEST 1 VIEW COMPARISON:  Chest x-ray dated July 30, 2022 FINDINGS: Cardiac and mediastinal contours are unchanged. Biapical pleural-parenchymal scarring. Small bilateral pleural effusions and bibasilar atelectasis. No evidence of pneumothorax. IMPRESSION: Small bilateral pleural effusions and bibasilar atelectasis. Electronically Signed   By: Yetta Glassman M.D.   On: 07/21/2022 09:55    Microbiology: Recent Results (from the past 240 hour(s))  Culture, blood (routine x 2)     Status: None (Preliminary result)   Collection Time: 08/06/2022  9:42 AM   Specimen: BLOOD RIGHT FOREARM  Result Value Ref Range Status   Specimen Description BLOOD RIGHT FOREARM  Final   Special Requests   Final    BOTTLES DRAWN AEROBIC AND ANAEROBIC Blood Culture results may not be optimal due to an inadequate volume of blood received in culture bottles   Culture   Final    NO GROWTH 2 DAYS Performed at [facility], [place] [address] [place], [place] [address]    Report Status PENDING  Incomplete  Culture, blood (routine x 2)     Status: None (Preliminary result)   Collection Time: 07/23/2022  9:47 AM   Specimen: BLOOD RIGHT FOREARM  Result Value Ref Range Status   Specimen Description BLOOD RIGHT FOREARM  Final   Special Requests    Final    BOTTLES DRAWN AEROBIC AND ANAEROBIC Blood Culture results may not be optimal due to an inadequate volume of blood received in culture bottles   Culture   Final    NO GROWTH 2 DAYS Performed at [facility], [place] [address], [place], [place] [address]    Report Status PENDING  Incomplete    Time spent: 35 minutes  Signed: Oren Binet, MD 08-31-2022

## 2022-08-17 NOTE — TOC Progression Note (Signed)
Transition of Care [facility]) - Progression Note    Patient Details  Name: Sheri Hoffman MRN: [id number] Date of Birth: 11/30/1928  Transition of [facility]) CM/SW [place], LCSW Phone Number: 08/21/22, 9:46 AM  Clinical Narrative:    Per [facility], they do not have beds available at this time but will let CSW know if one opens.    Expected Discharge Plan: [place] Barriers to Discharge: Hospice Bed not available  Expected Discharge Plan and Services Expected Discharge Plan: [place] In-house Referral: Clinical Social Work   Post Acute Care Choice: Residential Hospice Bed Living arrangements for the past 2 months: [place]                                       Social Determinants of Health (SDOH) Interventions    Readmission Risk Interventions     No data to display

## 2022-08-17 DEATH — deceased

## 2022-08-18 LAB — CULTURE, BLOOD (ROUTINE X 2)
Culture: NO GROWTH
Culture: NO GROWTH
